# Patient Record
Sex: Male | Born: 1940 | Race: Black or African American | Hispanic: No | Smoking: Current every day smoker
Health system: Southern US, Community
[De-identification: ages and names within clinical notes are randomized; demographics above are authoritative.]

## PROBLEM LIST (undated history)

## (undated) DIAGNOSIS — J189 Pneumonia, unspecified organism: Secondary | ICD-10-CM

## (undated) DIAGNOSIS — I10 Essential (primary) hypertension: Secondary | ICD-10-CM

## (undated) DIAGNOSIS — C801 Malignant (primary) neoplasm, unspecified: Secondary | ICD-10-CM

## (undated) HISTORY — DX: Essential (primary) hypertension: I10

## (undated) HISTORY — PX: HERNIA REPAIR: SHX51

---

## 1979-09-20 HISTORY — PX: TONSILLECTOMY: SHX5217

## 2014-04-13 LAB — CBC AND DIFFERENTIAL
Hemoglobin: 12.8 g/dL — AB (ref 13.5–17.5)
Platelets: 189 10*3/uL (ref 150–399)
WBC: 8.7 10^3/mL

## 2014-04-13 LAB — BASIC METABOLIC PANEL
CREATININE: 1.3 mg/dL (ref 0.6–1.3)
Glucose: 111 mg/dL
Potassium: 3.9 mmol/L (ref 3.4–5.3)

## 2014-04-13 LAB — HEPATIC FUNCTION PANEL
ALT: 21 U/L (ref 10–40)
AST: 34 U/L (ref 14–40)
Alkaline Phosphatase: 73 U/L (ref 25–125)

## 2014-12-26 ENCOUNTER — Ambulatory Visit: Payer: Self-pay | Admitting: Family Medicine

## 2015-01-06 ENCOUNTER — Encounter: Payer: Self-pay | Admitting: Family Medicine

## 2015-01-06 ENCOUNTER — Ambulatory Visit (INDEPENDENT_AMBULATORY_CARE_PROVIDER_SITE_OTHER): Payer: Medicare Other | Admitting: Family Medicine

## 2015-01-06 VITALS — BP 133/82 | HR 76 | Ht 68.0 in | Wt 166.0 lb

## 2015-01-06 DIAGNOSIS — R1084 Generalized abdominal pain: Secondary | ICD-10-CM | POA: Diagnosis not present

## 2015-01-06 DIAGNOSIS — I10 Essential (primary) hypertension: Secondary | ICD-10-CM | POA: Diagnosis not present

## 2015-01-06 DIAGNOSIS — J449 Chronic obstructive pulmonary disease, unspecified: Secondary | ICD-10-CM | POA: Insufficient documentation

## 2015-01-06 DIAGNOSIS — M25519 Pain in unspecified shoulder: Secondary | ICD-10-CM

## 2015-01-06 DIAGNOSIS — N400 Enlarged prostate without lower urinary tract symptoms: Secondary | ICD-10-CM | POA: Diagnosis not present

## 2015-01-06 DIAGNOSIS — K219 Gastro-esophageal reflux disease without esophagitis: Secondary | ICD-10-CM | POA: Insufficient documentation

## 2015-01-06 MED ORDER — AMLODIPINE BESYLATE 5 MG PO TABS: 5.0000 mg | ORAL_TABLET | Freq: Every day | ORAL | Status: DC

## 2015-01-06 MED ORDER — LISINOPRIL-HYDROCHLOROTHIAZIDE 20-12.5 MG PO TABS: 1.0000 | ORAL_TABLET | Freq: Every day | ORAL | Status: DC

## 2015-01-06 MED ORDER — TRAMADOL HCL 50 MG PO TABS
50.0000 mg | ORAL_TABLET | Freq: Three times a day (TID) | ORAL | Status: DC | PRN
Start: 2015-01-06 — End: 2016-05-18

## 2015-01-06 MED ORDER — TAMSULOSIN HCL 0.4 MG PO CAPS: 0.4000 mg | ORAL_CAPSULE | Freq: Every day | ORAL | Status: DC

## 2015-01-06 NOTE — Progress Notes (Addendum)
CC: Jeffrey Watson is a 74 y.o. male is here for Establish Care   Subjective: HPI:  Pleasant 74 year old here to establish care  Reports a history of COPD. He tells me he's been a smoker for 60 years now. He thought about quitting but has never had any success with actually attempting to quit. He tells me he's been diagnosed with COPD and currently uses albuterol a few times a week for shortness of breath. He denies any recent decline in the severity of his shortness of breath described as mild and infrequent. Complains of a cough that's present frequently throughout the day nonproductive and has been present for years, mild in severity.  Reports a history of essential hypertension that stems back for an unknown amount of time but at least the last year. Symptoms were uncontrolled on lisinopril-hydrochlorothiazide alone. He describes having blood pressure spikes up into the stage II hypertension range on that regimen however once amlodipine 5 mg was added to his blood pressure has been stable in the normotensive range. He's run out of medication as of this morning and is requesting refills. He believes he had blood work done last week that possibly checked his kidney function.  Reports a history of BPH and without Flomax will urinate 3-4 times at night. He tells me that provided he takes this and what sounds to be a over-the-counter version of saw palmetto he has no difficulty going to the night without urinating, has no difficulty with urinating in general, and declines any weak stream or urinary hesitancy.  He reports some generalized abdominal pain that is localized around the umbilicus that radiates from the left to right and lasts about 5 seconds, it's described as sharp, nothing particularly makes it better or worse. It goes away without any particular intervention. Happens a couple times a week. No dietary link to symptoms nor a pattern of phallus his bowel habits. He had a CT scan done of the  abdomen last week and was told that he should consider getting surgery for umbilical hernia. He denies any pain at the umbilicus or any protrusion at the umbilicus recently or remotely.  Complains of bilateral shoulder pain localized in the muscles if he is active during the day. In the past he was given Lortab that he took a few times a week if he was having a particularly strenuous day. Pain is localized on the back of the shoulders somewhat in the joints. He's had trigger point injections but they were unsuccessful at helping with pain. Over-the-counter medications do not seem to provide any benefit from pain relief. Severity is moderate at most absent 20 minutes after taking Lortab.   Review of Systems - General ROS: negative for - chills, fever, night sweats, weight gain or weight loss Ophthalmic ROS: negative for - decreased vision Psychological ROS: negative for - anxiety or depression ENT ROS: negative for - hearing change, nasal congestion, tinnitus or allergies Hematological and Lymphatic ROS: negative for - bleeding problems, bruising or swollen lymph nodes Breast ROS: negative Respiratory ROS: no cough, shortness of breath, or wheezing Cardiovascular ROS: no chest pain or dyspnea on exertion Gastrointestinal ROS: no abdominal pain, change in bowel habits, or black or bloody stools Genito-Urinary ROS: negative for - genital discharge, genital ulcers, incontinence or abnormal bleeding from genitals Musculoskeletal ROS: negative for - joint pain or muscle pain Neurological ROS: negative for - headaches or memory loss Dermatological ROS: negative for lumps, mole changes, rash and skin lesion changes  Past Medical  History  Diagnosis Date  . Hypertension     Past Surgical History  Procedure Laterality Date  . Tonsillectomy  1981   Family History  Problem Relation Age of Onset  . Stomach cancer      Grandma  . Heart attack Mother   . Stroke      Grandma    History   Social  History  . Marital Status: Unknown    Spouse Name: N/A  . Number of Children: N/A  . Years of Education: N/A   Occupational History  . Not on file.   Social History Main Topics  . Smoking status: Current Every Day Smoker -- 60 years  . Smokeless tobacco: Not on file  . Alcohol Use: 3.0 oz/week    5 Standard drinks or equivalent per week  . Drug Use: No  . Sexual Activity: Not Currently   Other Topics Concern  . Not on file   Social History Narrative  . No narrative on file     Objective: BP 133/82 mmHg  Pulse 76  Ht '5\' 8"'$  (1.727 m)  Wt 166 lb (75.297 kg)  BMI 25.25 kg/m2  General: Alert and Oriented, No Acute Distress HEENT: Pupils equal, round, reactive to light. Conjunctivae clear.  Moist mucous membranes pharynx unremarkable Lungs: Clear to auscultation bilaterally, no wheezing/ronchi/rales.  Comfortable work of breathing. Good air movement. Cardiac: Regular rate and rhythm. Normal S1/S2.  No murmurs, rubs, nor gallops.   Abdomen: Soft nontender, no palpable masses especially at the umbilicus Extremities: No peripheral edema.  Strong peripheral pulses.  Mental Status: No depression, anxiety, nor agitation. Skin: Warm and dry.  Assessment & Plan: Jeffrey Watson was seen today for establish care.  Diagnoses and all orders for this visit:  COPD, mild  Essential hypertension Orders: -     lisinopril-hydrochlorothiazide (PRINZIDE,ZESTORETIC) 20-12.5 MG per tablet; Take 1 tablet by mouth daily. -     amLODipine (NORVASC) 5 MG tablet; Take 1 tablet (5 mg total) by mouth daily.  BPH (benign prostatic hyperplasia) Orders: -     tamsulosin (FLOMAX) 0.4 MG CAPS capsule; Take 1 capsule (0.4 mg total) by mouth daily.  Generalized abdominal pain  Pain in joint, shoulder region, unspecified laterality  Other orders -     traMADol (ULTRAM) 50 MG tablet; Take 1 tablet (50 mg total) by mouth every 8 (eight) hours as needed for moderate pain.   COPD: Urged to quit smoking advised  to consider using nicotine patch to help with smoking cessation. Needs new nebulizer machine to use PRN albuterol solution. Essential hypertension: Controlled continue lisinopril-hydrochlorothiazide and amlodipine. Requesting outside records for most recent renal function check BPH: Controlled on Flomax Shoulder pain: I will not be able to help him with a long-term prescription of any opiates other than tramadol. Generalized abdominal pain: Requesting outside records of most recent blood work and CT scan since I'm unable to detect any abdominal wall defect around his umbilicus.   Return in about 3 months (around 04/07/2015).

## 2015-01-08 ENCOUNTER — Telehealth: Payer: Self-pay | Admitting: Family Medicine

## 2015-01-08 NOTE — Telephone Encounter (Signed)
Pt.notified

## 2015-01-08 NOTE — Telephone Encounter (Signed)
Jeffrey Watson, Will you please let patinet know that I got his abdominal CT scan from July and it showed no abnormality of the abdomen.  He mentioned something about surgery might be needed on the abdomen however these results show no need for any surgical procedure.

## 2015-01-20 ENCOUNTER — Encounter: Payer: Self-pay | Admitting: Family Medicine

## 2015-01-21 ENCOUNTER — Encounter: Payer: Self-pay | Admitting: Family Medicine

## 2015-01-21 ENCOUNTER — Ambulatory Visit (INDEPENDENT_AMBULATORY_CARE_PROVIDER_SITE_OTHER): Payer: Medicare Other | Admitting: Family Medicine

## 2015-01-21 ENCOUNTER — Ambulatory Visit (INDEPENDENT_AMBULATORY_CARE_PROVIDER_SITE_OTHER): Payer: Medicare Other

## 2015-01-21 VITALS — BP 147/77 | HR 97 | Wt 165.0 lb

## 2015-01-21 DIAGNOSIS — M542 Cervicalgia: Secondary | ICD-10-CM

## 2015-01-21 DIAGNOSIS — R1084 Generalized abdominal pain: Secondary | ICD-10-CM

## 2015-01-21 DIAGNOSIS — M47892 Other spondylosis, cervical region: Secondary | ICD-10-CM | POA: Diagnosis not present

## 2015-01-21 NOTE — Progress Notes (Signed)
CC: Jeffrey Watson is a 74 y.o. male is here for right shoulder pain   Subjective: HPI:  Complains of posterior neck pain at the base of the neck that has been present off and on for matter of years. Tramadol and I gave him at the last visit has been ineffective. He's tried a variety of muscle relaxers that are also ineffective. He is not sure if he's ever had imaging done of the neck. Pain is radiating across the top of the shoulders. It's worse with any sudden left or right rotation of the neck or any left or right side bending. Percocet has helped in the past. Pain seems to be worse if he is out of strenuous day with using his upper extremities. Denies any recent or remote trauma or overexertion. Denies any shoulder joint pain. Symptoms are mild-to-moderate in severity. Denies any motor or sensory disturbances in the upper extremities   Complains of abdominal pain that is localized in the epigastric region which is nonradiating. It described as a soreness that comes on for matter of seconds. It does not seem to correlate with dietary habits or bowel movements. He denies diarrhea but does have subjective constipation where he will frequently go 3 days without having a bowel movement. He has had unintentional weight gain. He tells me that he had a CT scan done within the last 1 or 2 months and was told that he needed surgery by his former PCP but he decided to avoid this intervention, he is uncertain of the results of the CT. Denies nausea, vomiting or flank pain.   Review Of Systems Outlined In HPI  Past Medical History  Diagnosis Date  . Hypertension     Past Surgical History  Procedure Laterality Date  . Tonsillectomy  1981   Family History  Problem Relation Age of Onset  . Stomach cancer      Grandma  . Heart attack Mother   . Stroke      Grandma    History   Social History  . Marital Status: Unknown    Spouse Name: N/A  . Number of Children: N/A  . Years of Education: N/A    Occupational History  . Not on file.   Social History Main Topics  . Smoking status: Current Every Day Smoker -- 60 years  . Smokeless tobacco: Not on file  . Alcohol Use: 3.0 oz/week    5 Standard drinks or equivalent per week  . Drug Use: No  . Sexual Activity: Not Currently   Other Topics Concern  . Not on file   Social History Narrative     Objective: BP 147/77 mmHg  Pulse 97  Wt 165 lb (74.844 kg)  General: Alert and Oriented, No Acute Distress HEENT: Pupils equal, round, reactive to light. Conjunctivae clear.  Moist mucous membranes Lungs:  Clear comfortable work of breathing Cardiac: Regular rate and rhythm.  Neck: Full range of motion and strength of the cervical spine. Pain is reproduced with palpation of the C6 spinous process. Pain is also reproduced with paraspinal musculature and upper trapezius palpation just lateral to C6 and C5. Upper trapezius is moderately hypertonic. Spurling's negative bilaterally Abdomen:  Soft flat Extremities: No peripheral edema.  Strong peripheral pulses.  Mental Status: No depression, anxiety, nor agitation. Skin: Warm and dry.  Assessment & Plan: Jeffrey Watson was seen today for right shoulder pain.  Diagnoses and all orders for this visit:  Generalized abdominal pain  Neck pain Orders: -  DG Cervical Spine Complete; Future    generalized abdominal pain: Records that I received from his former providers office included a CT scan most recently from July 2015 that did not show any abnormality to account for his abdominal pain. He strongly believes that he had a CT scan sometime within the last 1 or 2 months at [facility] in [facility]. I've asked my assistant to reach out to them specifically regarding any CT scans from now stemming back to July 2015. Neck pain: Cervical spine films will determine further treatment plan, obtain films today.  Return if symptoms worsen or fail to improve.

## 2015-01-22 ENCOUNTER — Telehealth: Payer: Self-pay | Admitting: Family Medicine

## 2015-01-22 DIAGNOSIS — M542 Cervicalgia: Secondary | ICD-10-CM

## 2015-01-22 MED ORDER — LUBIPROSTONE 24 MCG PO CAPS: 24.0000 ug | ORAL_CAPSULE | Freq: Two times a day (BID) | ORAL | Status: DC

## 2015-01-22 NOTE — Telephone Encounter (Signed)
Jeffrey Watson, Will you please let patient know that I reviewed his CT scan from 01/02/2015 and it did not show any abnormality to account for his abdominal pain therefore I suspect his pain is coming from constipation and I've sent a Rx of lubiprostone to his [facility] in hopes that it relieves his pain.

## 2015-01-22 NOTE — Telephone Encounter (Signed)
Pt.notified

## 2015-01-22 NOTE — Telephone Encounter (Signed)
Left message on vm

## 2015-01-22 NOTE — Telephone Encounter (Signed)
Jeffrey Watson, Will you please let patient know that his neck xrays show multiple levels of age related degenerative changes and the radiologist who looked at his films has recommended a MRI for further evaluation of where his pain is coming from, an order has been placed for this, please contact me if not notified about scheduling by next week.

## 2015-02-23 ENCOUNTER — Encounter: Payer: Self-pay | Admitting: Family Medicine

## 2015-02-23 ENCOUNTER — Ambulatory Visit (INDEPENDENT_AMBULATORY_CARE_PROVIDER_SITE_OTHER): Payer: Medicare Other | Admitting: Family Medicine

## 2015-02-23 ENCOUNTER — Ambulatory Visit (INDEPENDENT_AMBULATORY_CARE_PROVIDER_SITE_OTHER): Payer: Medicare Other

## 2015-02-23 VITALS — BP 153/86 | HR 64 | Wt 162.0 lb

## 2015-02-23 DIAGNOSIS — M7712 Lateral epicondylitis, left elbow: Secondary | ICD-10-CM

## 2015-02-23 DIAGNOSIS — R1084 Generalized abdominal pain: Secondary | ICD-10-CM

## 2015-02-23 DIAGNOSIS — R140 Abdominal distension (gaseous): Secondary | ICD-10-CM | POA: Diagnosis not present

## 2015-02-23 MED ORDER — DICYCLOMINE HCL 20 MG PO TABS: ORAL_TABLET | ORAL | Status: DC

## 2015-02-23 NOTE — Progress Notes (Signed)
CC: Jeffrey Watson is a 74 y.o. male is here for f/u stomach pain   Subjective: HPI:  Abdominal pain localized in the anterior aspect of the lower abdomen feels like it's deep which lasts anywhere from 1-10 seconds. Nothing particularly makes it better or worse. It can happen when he is eating, anytime after eating or while fasting. Nothing seems to make it better. It occurs 1-3 times a day. There is no radiation. He feels like it has almost electric quality to it. Interventions have included Amitiza which has helped with regular bowel movements but nothing with the pain. He had a CT scan to workup this pain earlier this year which was unremarkable. He is uncertain how long he's had the discomfort but it's been at least since the start of 2016. Denies any nausea, unintentional weight loss, diarrhea nor constipation.  Review of systems positive for a bloating sensation in the abdomen.  Left lateral elbow pain that began 2 weeks ago the day after he was lifting heavy objects with a friend. Worse with lifting light objects such as a coffee mug. Nonradiating. Feels sharp. Mild in severity. He's never had this before denies any overlying skin changes at the site of discomfort. Symptoms are slowly improving without any particular intervention.   Review Of Systems Outlined In HPI  Past Medical History  Diagnosis Date  . Hypertension     Past Surgical History  Procedure Laterality Date  . Tonsillectomy  1981   Family History  Problem Relation Age of Onset  . Stomach cancer      Grandma  . Heart attack Mother   . Stroke      Grandma    History   Social History  . Marital Status: Unknown    Spouse Name: N/A  . Number of Children: N/A  . Years of Education: N/A   Occupational History  . Not on file.   Social History Main Topics  . Smoking status: Current Every Day Smoker -- 60 years  . Smokeless tobacco: Not on file  . Alcohol Use: 3.0 oz/week    5 Standard drinks or equivalent per week   . Drug Use: No  . Sexual Activity: Not Currently   Other Topics Concern  . Not on file   Social History Narrative     Objective: BP 153/86 mmHg  Pulse 64  Wt 162 lb (73.483 kg)  General: Alert and Oriented, No Acute Distress HEENT: Pupils equal, round, reactive to light. Conjunctivae clear.  Moist mucous membranes Lungs: Clear to auscultation bilaterally, no wheezing/ronchi/rales.  Comfortable work of breathing. Good air movement. Cardiac: Regular rate and rhythm. Normal S1/S2.  No murmurs, rubs, nor gallops.   Abdomen: Normal bowel sounds, soft and non tender without palpable masses. No guarding or rebound tenderness Extremities: No peripheral edema.  Strong peripheral pulses. Left Elbow pain localized to the lateral epicondyle reproduced with only direct pressure at the site. Mental Status: No depression, anxiety, nor agitation. Skin: Warm and dry.  Assessment & Plan: Nicholson was seen today for f/u stomach pain.  Diagnoses and all orders for this visit:  Generalized abdominal pain Orders: -     DG Abd 2 Views; Future  Abdominal bloating Orders: -     DG Abd 2 Views; Future -     dicyclomine (BENTYL) 20 MG tablet; One by mouth up to every six hours only as needed for abdominal swelling or discomfort.  Left tennis elbow Orders: -     DG Abd 2  Views; Future   X-ray was obtained to evaluate for fecal burden, does not appear that constipation is playing into his pain therefore will treat as bowel spasms with dicyclomine and increasing fiber in diet. Left tennis elbow: Home rebuilt up exercises were provided to help speed up the healing process.  No Follow-up on file.

## 2015-02-27 ENCOUNTER — Telehealth: Payer: Self-pay | Admitting: Family Medicine

## 2015-02-27 DIAGNOSIS — I10 Essential (primary) hypertension: Secondary | ICD-10-CM

## 2015-02-27 MED ORDER — LISINOPRIL-HYDROCHLOROTHIAZIDE 20-12.5 MG PO TABS: 1.0000 | ORAL_TABLET | Freq: Every day | ORAL | Status: DC

## 2015-02-27 NOTE — Telephone Encounter (Signed)
Refill req 

## 2015-03-09 ENCOUNTER — Other Ambulatory Visit: Payer: Medicare Other

## 2015-03-16 ENCOUNTER — Telehealth: Payer: Self-pay | Admitting: Family Medicine

## 2015-03-16 ENCOUNTER — Ambulatory Visit: Payer: Medicare Other

## 2015-03-16 MED ORDER — DIAZEPAM 10 MG PO TABS: ORAL_TABLET | ORAL | Status: DC

## 2015-03-16 NOTE — Addendum Note (Signed)
Addended by: Marcial Pacas on: 03/16/2015 05:00 PM   Modules accepted: Orders

## 2015-03-16 NOTE — Telephone Encounter (Signed)
Jeffrey Watson, Rx placed in in-box ready for pickup/faxing. I need the MRI results before I can give him options on how to improve the pain. For now try OTC Aleve up to two tabs BID

## 2015-03-16 NOTE — Telephone Encounter (Signed)
Pt had mri scheduled today but could not complete due to being claustrophobic and the person in imaging sent him back up here to see if you could prescribe him something for the anxiety so he is able to complete the mri. The MRI authorization expires 04/25/15. Also the pt wants to know if he can be prescribed something for pain.

## 2015-03-17 NOTE — Telephone Encounter (Signed)
Pt.notified

## 2015-03-24 ENCOUNTER — Ambulatory Visit (INDEPENDENT_AMBULATORY_CARE_PROVIDER_SITE_OTHER): Payer: Medicare Other

## 2015-03-24 DIAGNOSIS — M5032 Other cervical disc degeneration, mid-cervical region: Secondary | ICD-10-CM

## 2015-03-24 DIAGNOSIS — M4802 Spinal stenosis, cervical region: Secondary | ICD-10-CM

## 2015-03-24 DIAGNOSIS — M2578 Osteophyte, vertebrae: Secondary | ICD-10-CM | POA: Diagnosis not present

## 2015-03-24 DIAGNOSIS — M5022 Other cervical disc displacement, mid-cervical region: Secondary | ICD-10-CM

## 2015-03-25 ENCOUNTER — Telehealth: Payer: Self-pay | Admitting: Family Medicine

## 2015-03-25 DIAGNOSIS — M5030 Other cervical disc degeneration, unspecified cervical region: Secondary | ICD-10-CM

## 2015-03-25 DIAGNOSIS — M4802 Spinal stenosis, cervical region: Secondary | ICD-10-CM

## 2015-03-25 NOTE — Telephone Encounter (Signed)
Jeffrey Watson, Will you please let patient know that his MRI confirmed some impingement on his spinal canal and nerve roots which would explain why he's having pain.  I'm placing a pain management referral for further help.  When I return from my conference I can get him a Rx of Vicodin but I can't print this out in my current situation.

## 2015-03-25 NOTE — Telephone Encounter (Signed)
Pt.notified

## 2015-03-27 ENCOUNTER — Telehealth: Payer: Self-pay | Admitting: *Deleted

## 2015-03-27 DIAGNOSIS — M5030 Other cervical disc degeneration, unspecified cervical region: Secondary | ICD-10-CM

## 2015-03-27 NOTE — Telephone Encounter (Signed)
Referral placed.

## 2015-03-30 MED ORDER — HYDROCODONE-ACETAMINOPHEN 5-325 MG PO TABS: 1.0000 | ORAL_TABLET | Freq: Four times a day (QID) | ORAL | Status: DC | PRN

## 2015-03-30 NOTE — Telephone Encounter (Signed)
Pt notified and rx up front

## 2015-03-30 NOTE — Addendum Note (Signed)
Addended by: Marcial Pacas on: 03/30/2015 03:15 PM   Modules accepted: Orders

## 2015-03-30 NOTE — Telephone Encounter (Signed)
Andrea, Rx placed in in-box ready for pickup/faxing.  

## 2015-04-08 ENCOUNTER — Ambulatory Visit: Payer: Medicare Other | Attending: Family Medicine | Admitting: Physical Therapy

## 2015-06-01 ENCOUNTER — Ambulatory Visit (INDEPENDENT_AMBULATORY_CARE_PROVIDER_SITE_OTHER): Payer: Medicare Other

## 2015-06-01 ENCOUNTER — Encounter: Payer: Self-pay | Admitting: Family Medicine

## 2015-06-01 ENCOUNTER — Ambulatory Visit (INDEPENDENT_AMBULATORY_CARE_PROVIDER_SITE_OTHER): Payer: Medicare Other | Admitting: Family Medicine

## 2015-06-01 VITALS — BP 136/75 | HR 75 | Wt 162.0 lb

## 2015-06-01 DIAGNOSIS — R079 Chest pain, unspecified: Secondary | ICD-10-CM | POA: Diagnosis not present

## 2015-06-01 DIAGNOSIS — J449 Chronic obstructive pulmonary disease, unspecified: Secondary | ICD-10-CM

## 2015-06-01 NOTE — Progress Notes (Signed)
CC: Jeffrey Watson is a 74 y.o. male is here for pain in left shoulder   Subjective: HPI:  Yesterday up early in the morning around 2 AM to urinate when he got back to bed and was playing on his phone he had a sudden onset of moderate left-sided chest pain just below the clavicle. Nothing seemed to make symptoms better or worse. After 5 or 10 minutes it went away "like a switch just turned it off immediately". No interventions as of yet. He's never had this before. It was nonradiating and described as a throbbing sensation. He denies any accompanying motor or sensory disturbances, radiation of his pain nor shortness of breath. He denies any pain with breathing, cough, fevers, chills nor persistent pain. He tells me that pain felt extremely different than his chronic neck pain which is currently well controlled with medicine from his pain management clinic.   Review Of Systems Outlined In HPI  Past Medical History  Diagnosis Date  . Hypertension     Past Surgical History  Procedure Laterality Date  . Tonsillectomy  1981   Family History  Problem Relation Age of Onset  . Stomach cancer      Grandma  . Heart attack Mother   . Stroke      Grandma    Social History   Social History  . Marital Status: Unknown    Spouse Name: N/A  . Number of Children: N/A  . Years of Education: N/A   Occupational History  . Not on file.   Social History Main Topics  . Smoking status: Current Every Day Smoker -- 60 years  . Smokeless tobacco: Not on file  . Alcohol Use: 3.0 oz/week    5 Standard drinks or equivalent per week  . Drug Use: No  . Sexual Activity: Not Currently   Other Topics Concern  . Not on file   Social History Narrative     Objective: BP 136/75 mmHg  Pulse 75  Wt 162 lb (73.483 kg)  General: Alert and Oriented, No Acute Distress HEENT: Pupils equal, round, reactive to light. Conjunctivae clear.  Moist mucous membranes pharynx unremarkable  Lungs: Comfortable work of  breathing, no rales or wheezing. There is mild rhonchi in the left lung fields Cardiac: Regular rate and rhythm. Normal S1/S2.  No murmurs, rubs, nor gallops.   Left shoulder exam reveals full range of motion and strength in all planes of motion and with individual rotator cuff testing. No overlying redness warmth or swelling.  Neer's test negative.  Hawkins test negative. Empty can negative. Crossarm test negative. O'Brien's test negative. Apprehension test negative. Speed's test negative. Extremities: No peripheral edema.  Strong peripheral pulses.  Mental Status: No depression, anxiety, nor agitation. Skin: Warm and dry.  Assessment & Plan: Jeffrey Watson was seen today for pain in left shoulder.  Diagnoses and all orders for this visit:  Chest pain, unspecified chest pain type -     DG Chest 2 View; Future   Chest pain with a low suspicion of a cardiac etiology given how abruptly the onset was in the abrupt termination of pain. Obtain x-ray to rule out lung pathology especially given rhonchi on his exam today.Signs and symptoms requring emergent/urgent reevaluation were discussed with the patient. Ultimate plan will be based on the results of the x-ray  Return if symptoms worsen or fail to improve.

## 2015-06-04 ENCOUNTER — Other Ambulatory Visit: Payer: Self-pay | Admitting: Family Medicine

## 2015-06-23 ENCOUNTER — Other Ambulatory Visit: Payer: Self-pay | Admitting: Family Medicine

## 2015-07-06 ENCOUNTER — Ambulatory Visit (INDEPENDENT_AMBULATORY_CARE_PROVIDER_SITE_OTHER): Payer: Medicare Other | Admitting: Family Medicine

## 2015-07-06 ENCOUNTER — Encounter: Payer: Self-pay | Admitting: Family Medicine

## 2015-07-06 VITALS — BP 130/74 | HR 79 | Wt 164.0 lb

## 2015-07-06 DIAGNOSIS — J449 Chronic obstructive pulmonary disease, unspecified: Secondary | ICD-10-CM

## 2015-07-06 DIAGNOSIS — I10 Essential (primary) hypertension: Secondary | ICD-10-CM

## 2015-07-06 MED ORDER — AMLODIPINE BESYLATE 5 MG PO TABS: ORAL_TABLET | ORAL | Status: DC

## 2015-07-06 MED ORDER — LISINOPRIL-HYDROCHLOROTHIAZIDE 20-12.5 MG PO TABS: 1.0000 | ORAL_TABLET | Freq: Every day | ORAL | Status: DC

## 2015-07-06 MED ORDER — FLUTICASONE FUROATE-VILANTEROL 100-25 MCG/INH IN AEPB: INHALATION_SPRAY | RESPIRATORY_TRACT | Status: DC

## 2015-07-06 NOTE — Progress Notes (Signed)
CC: Jeffrey Watson is a 74 y.o. male is here for Hypertension   Subjective: HPI:   Follow-up essential hypertension: Continues to take amlodipine and lisinopril-HCTZ on a daily basis. No outside blood pressures report. Denies chest pain shortness of breath orthopnea nor peripheral edema.  Complains of productive cough that has been present for matter of years. He tells me he was actually was once on oxygen. He reports That albuterol provides some mild benefit. Denies any blood in sputum or chest pain. Nothing else seems to make symptoms better or worse.  Symptoms are mildly to moderately interfering with quality of life. Denies fevers, chills, nor unintentional weight loss.   Review Of Systems Outlined In HPI  Past Medical History  Diagnosis Date  . Hypertension     Past Surgical History  Procedure Laterality Date  . Tonsillectomy  1981   Family History  Problem Relation Age of Onset  . Stomach cancer      Grandma  . Heart attack Mother   . Stroke      Grandma    Social History   Social History  . Marital Status: Unknown    Spouse Name: N/A  . Number of Children: N/A  . Years of Education: N/A   Occupational History  . Not on file.   Social History Main Topics  . Smoking status: Current Every Day Smoker -- 60 years  . Smokeless tobacco: Not on file  . Alcohol Use: 3.0 oz/week    5 Standard drinks or equivalent per week  . Drug Use: No  . Sexual Activity: Not Currently   Other Topics Concern  . Not on file   Social History Narrative     Objective: BP 130/74 mmHg  Pulse 79  Wt 164 lb (74.39 kg)  General: Alert and Oriented, No Acute Distress HEENT: Pupils equal, round, reactive to light. Conjunctivae clear. Moist mucous membranes  Lungs: Clear to auscultation bilaterally, no wheezing/ronchi/rales.  Comfortable work of breathing. Good air movement. Cardiac: Regular rate and rhythm. Normal S1/S2.  No murmurs, rubs, nor gallops.   Extremities: No peripheral  edema.  Strong peripheral pulses.  Mental Status: No depression, anxiety, nor agitation. Skin: Warm and dry.  Assessment & Plan: Jeffrey Watson was seen today for hypertension.  Diagnoses and all orders for this visit:  Essential hypertension -     lisinopril-hydrochlorothiazide (PRINZIDE,ZESTORETIC) 20-12.5 MG tablet; Take 1 tablet by mouth daily.  COPD, mild ([place])  Other orders -     Fluticasone Furoate-Vilanterol (BREO ELLIPTA) 100-25 MCG/INH AEPB; One inhalation by mouth daily, wash mouth out after use. -     amLODipine (NORVASC) 5 MG tablet; TAKE ONE TABLET BY MOUTH ONCE DAILY   Essential hypertension: Controlled continue amlodipine and lisinopril-HCTZ COPD: Uncontrolled chronic condition continue as needed albuterol and beginning Breo.  if helping with cough I've asked him to call me for refills.   Return in about 3 months (around 10/06/2015).

## 2015-07-24 ENCOUNTER — Other Ambulatory Visit: Payer: Self-pay | Admitting: Family Medicine

## 2015-07-27 ENCOUNTER — Other Ambulatory Visit: Payer: Self-pay

## 2015-07-27 DIAGNOSIS — N400 Enlarged prostate without lower urinary tract symptoms: Secondary | ICD-10-CM

## 2015-07-27 MED ORDER — AMLODIPINE BESYLATE 5 MG PO TABS: ORAL_TABLET | ORAL | Status: DC

## 2015-07-27 MED ORDER — TAMSULOSIN HCL 0.4 MG PO CAPS: 0.4000 mg | ORAL_CAPSULE | Freq: Every day | ORAL | Status: DC

## 2015-10-06 ENCOUNTER — Encounter: Payer: Self-pay | Admitting: Family Medicine

## 2015-10-06 ENCOUNTER — Other Ambulatory Visit: Payer: Self-pay

## 2015-10-06 ENCOUNTER — Ambulatory Visit (INDEPENDENT_AMBULATORY_CARE_PROVIDER_SITE_OTHER): Payer: Self-pay | Admitting: Family Medicine

## 2015-10-06 DIAGNOSIS — Z0289 Encounter for other administrative examinations: Secondary | ICD-10-CM

## 2015-10-06 NOTE — Progress Notes (Signed)
No show 10/06/2015

## 2015-10-15 ENCOUNTER — Other Ambulatory Visit: Payer: Self-pay

## 2015-10-15 MED ORDER — RANITIDINE HCL 150 MG PO TABS: 150.0000 mg | ORAL_TABLET | Freq: Two times a day (BID) | ORAL | Status: DC

## 2016-01-12 ENCOUNTER — Other Ambulatory Visit: Payer: Self-pay | Admitting: Family Medicine

## 2016-01-31 ENCOUNTER — Other Ambulatory Visit: Payer: Self-pay | Admitting: Family Medicine

## 2016-02-01 ENCOUNTER — Telehealth: Payer: Self-pay | Admitting: Family Medicine

## 2016-02-01 NOTE — Telephone Encounter (Signed)
Refill req, needs follow up

## 2016-02-08 ENCOUNTER — Other Ambulatory Visit: Payer: Self-pay | Admitting: Family Medicine

## 2016-03-07 ENCOUNTER — Other Ambulatory Visit: Payer: Self-pay | Admitting: Family Medicine

## 2016-04-14 IMAGING — CR DG CERVICAL SPINE COMPLETE 4+V
5 series · 5 of 5 positions shown · non-contrast
Comparison: None.

CLINICAL DATA: Midline cervical pain for 1 or 2 months. No known
injury. Initial encounter.

EXAM:
CERVICAL SPINE  4+ VIEWS

[c-spine lat]
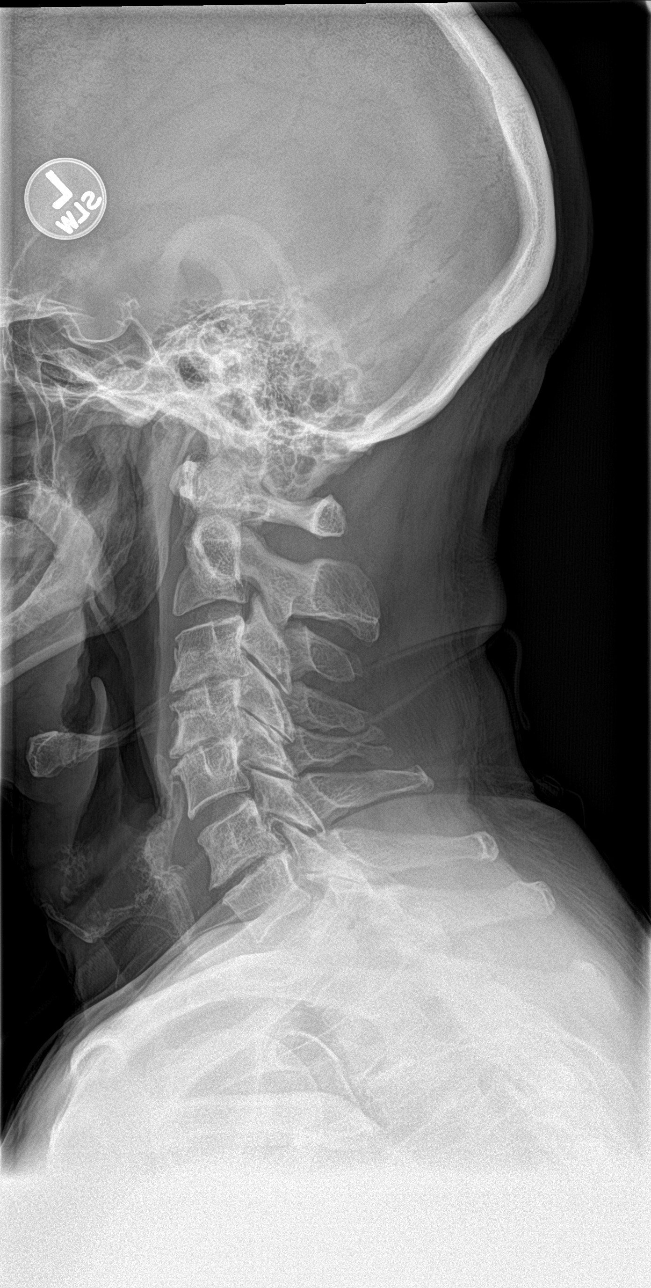

[c-spine obl (1 of 2)]
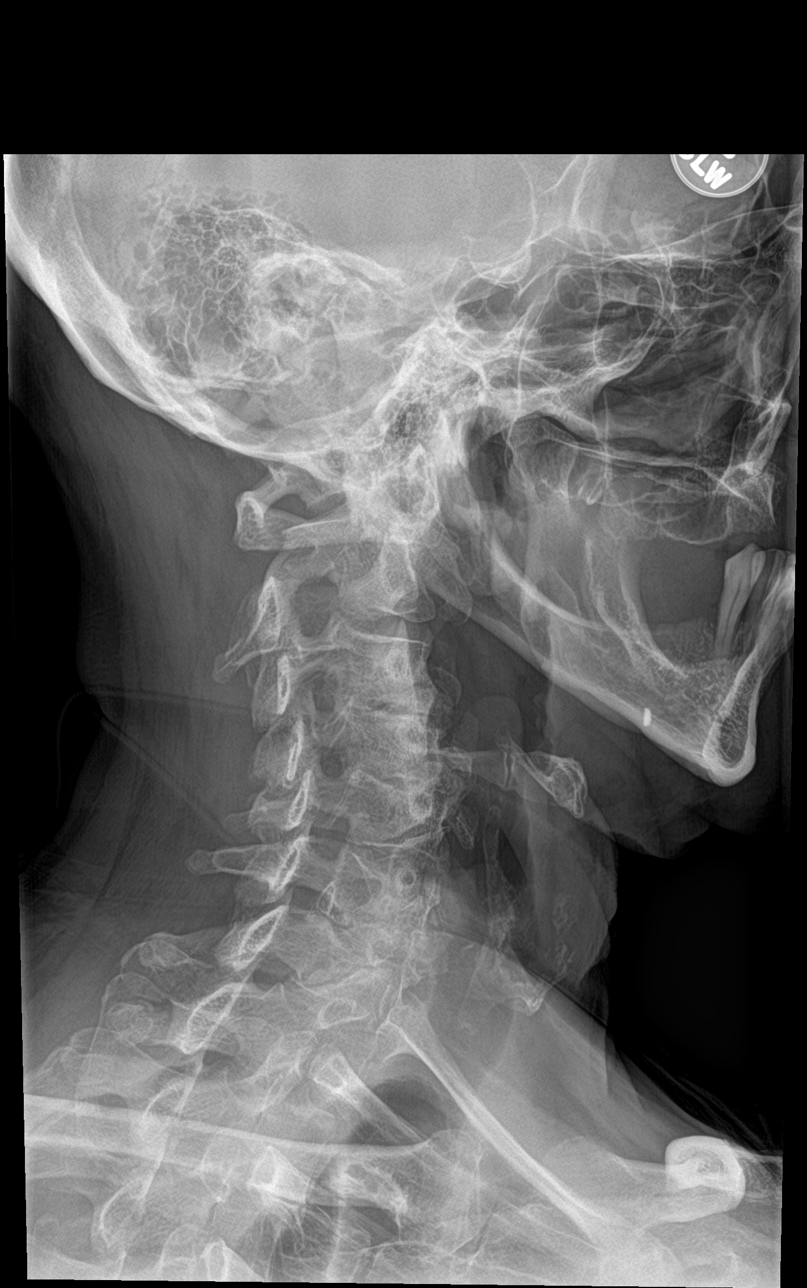

[c-spine obl (2 of 2)]
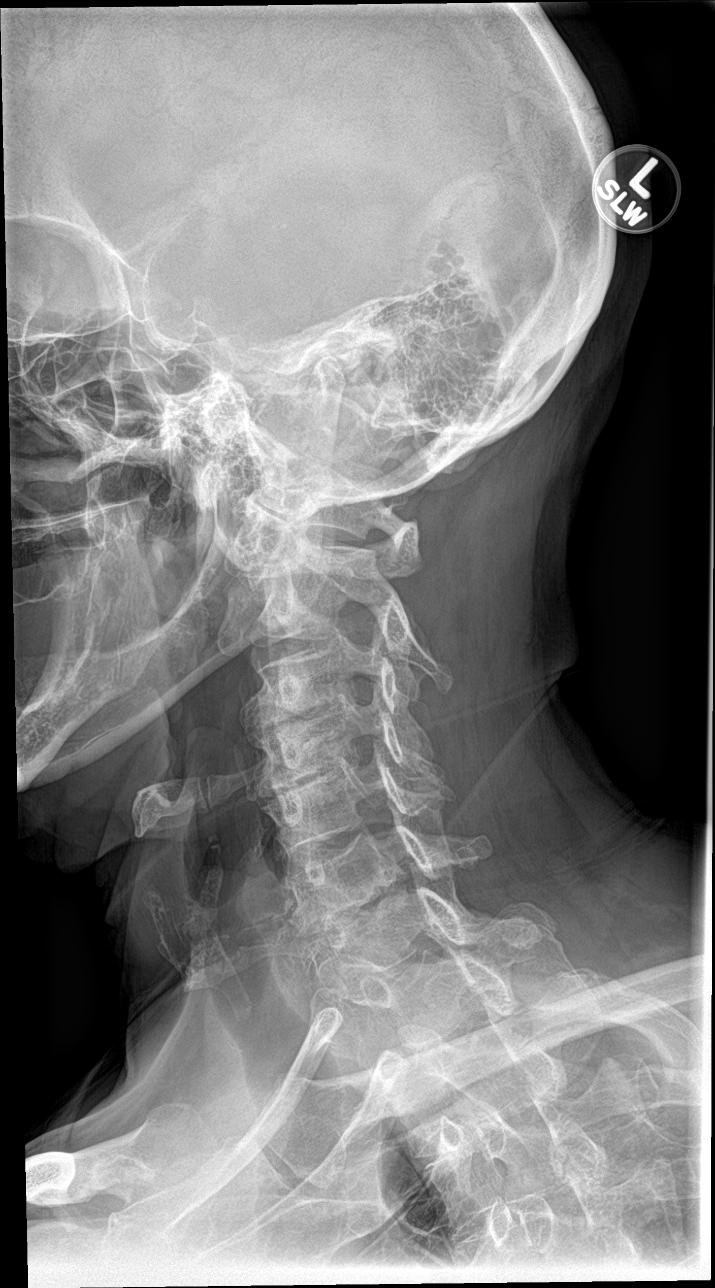

[c-spine ap]
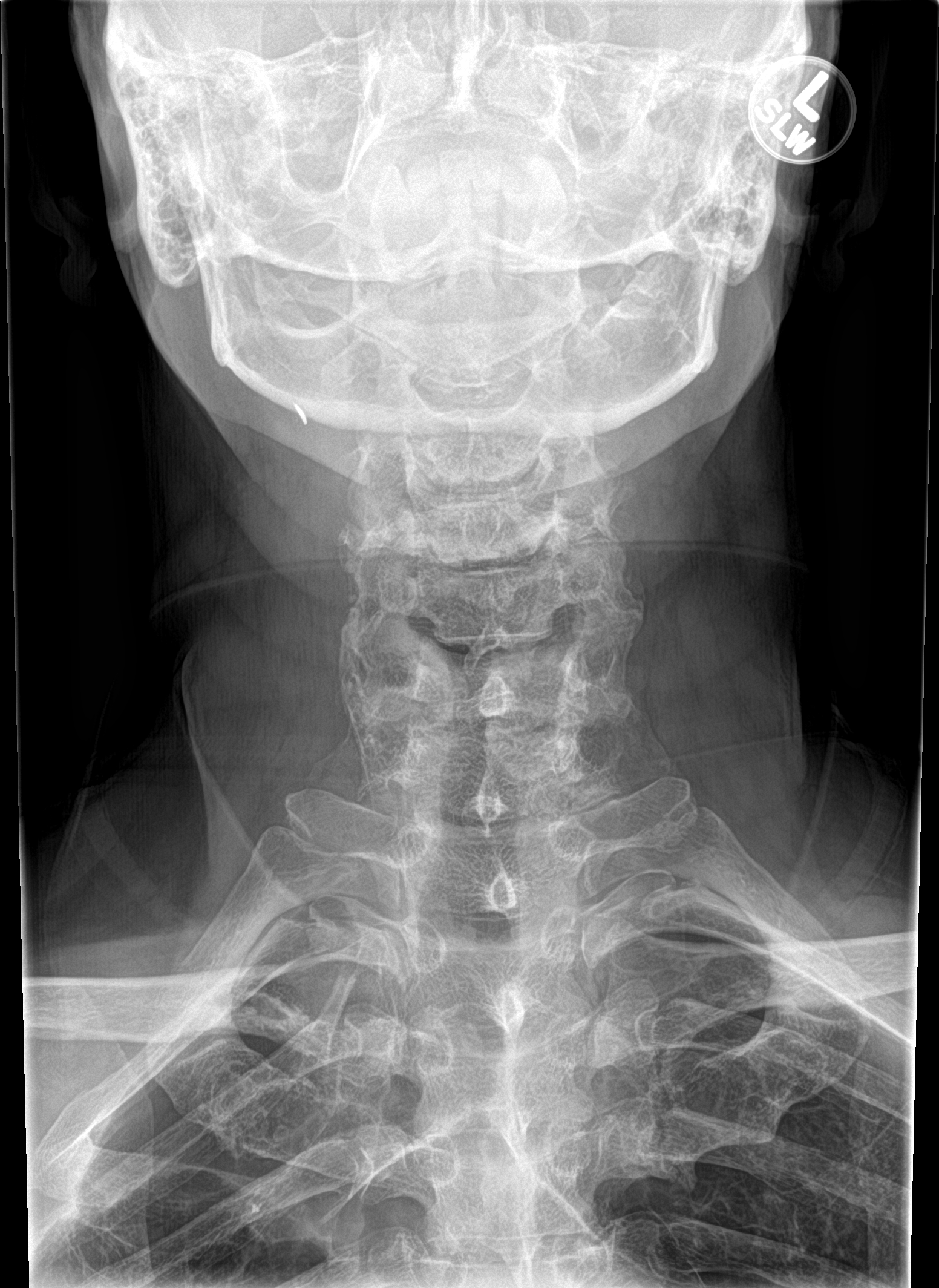

[c-spine open mouth]
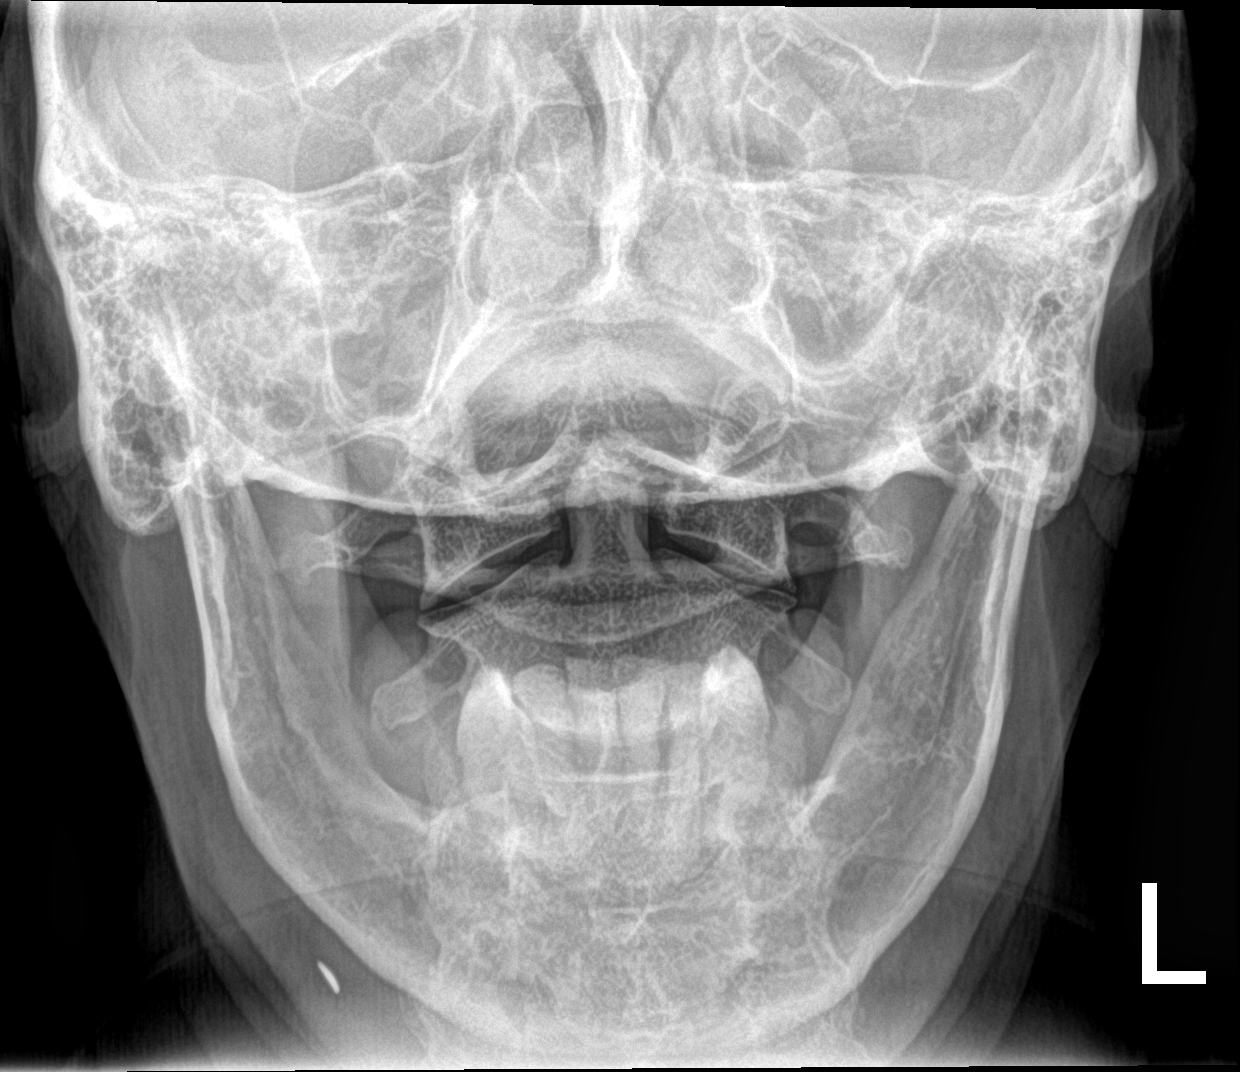

[5 of 5 positions shown; findings below may reference images not displayed]

FINDINGS: The prevertebral soft tissues are normal. The alignment is anatomic
through T1. There is no evidence of acute fracture or traumatic
subluxation. The C1-2 articulation appears normal in the AP
projection. There is advanced disc space loss with uncinate spurring
at C3-4 and C4-5. There is lesser disc space loss at C6-7. Oblique
views demonstrate mild biforaminal stenosis at C3-4 and C4-5 and
left-sided foraminal narrowing at C6-7.
IMPRESSION: Cervical spondylosis as described with associated foraminal
narrowing. No acute osseous findings or malalignment. Consider MRI
for further evaluation of the patient's symptoms.

## 2016-04-23 ENCOUNTER — Other Ambulatory Visit: Payer: Self-pay | Admitting: Family Medicine

## 2016-04-23 DIAGNOSIS — I10 Essential (primary) hypertension: Secondary | ICD-10-CM

## 2016-04-25 ENCOUNTER — Other Ambulatory Visit: Payer: Self-pay | Admitting: Family Medicine

## 2016-05-16 ENCOUNTER — Other Ambulatory Visit: Payer: Self-pay | Admitting: Family Medicine

## 2016-05-17 IMAGING — CR DG ABDOMEN 2V
3 series · 3 of 3 positions shown · non-contrast
Comparison: None.

CLINICAL DATA: Abdominal pain.

EXAM:
ABDOMEN - 2 VIEW

[abdomen erect]
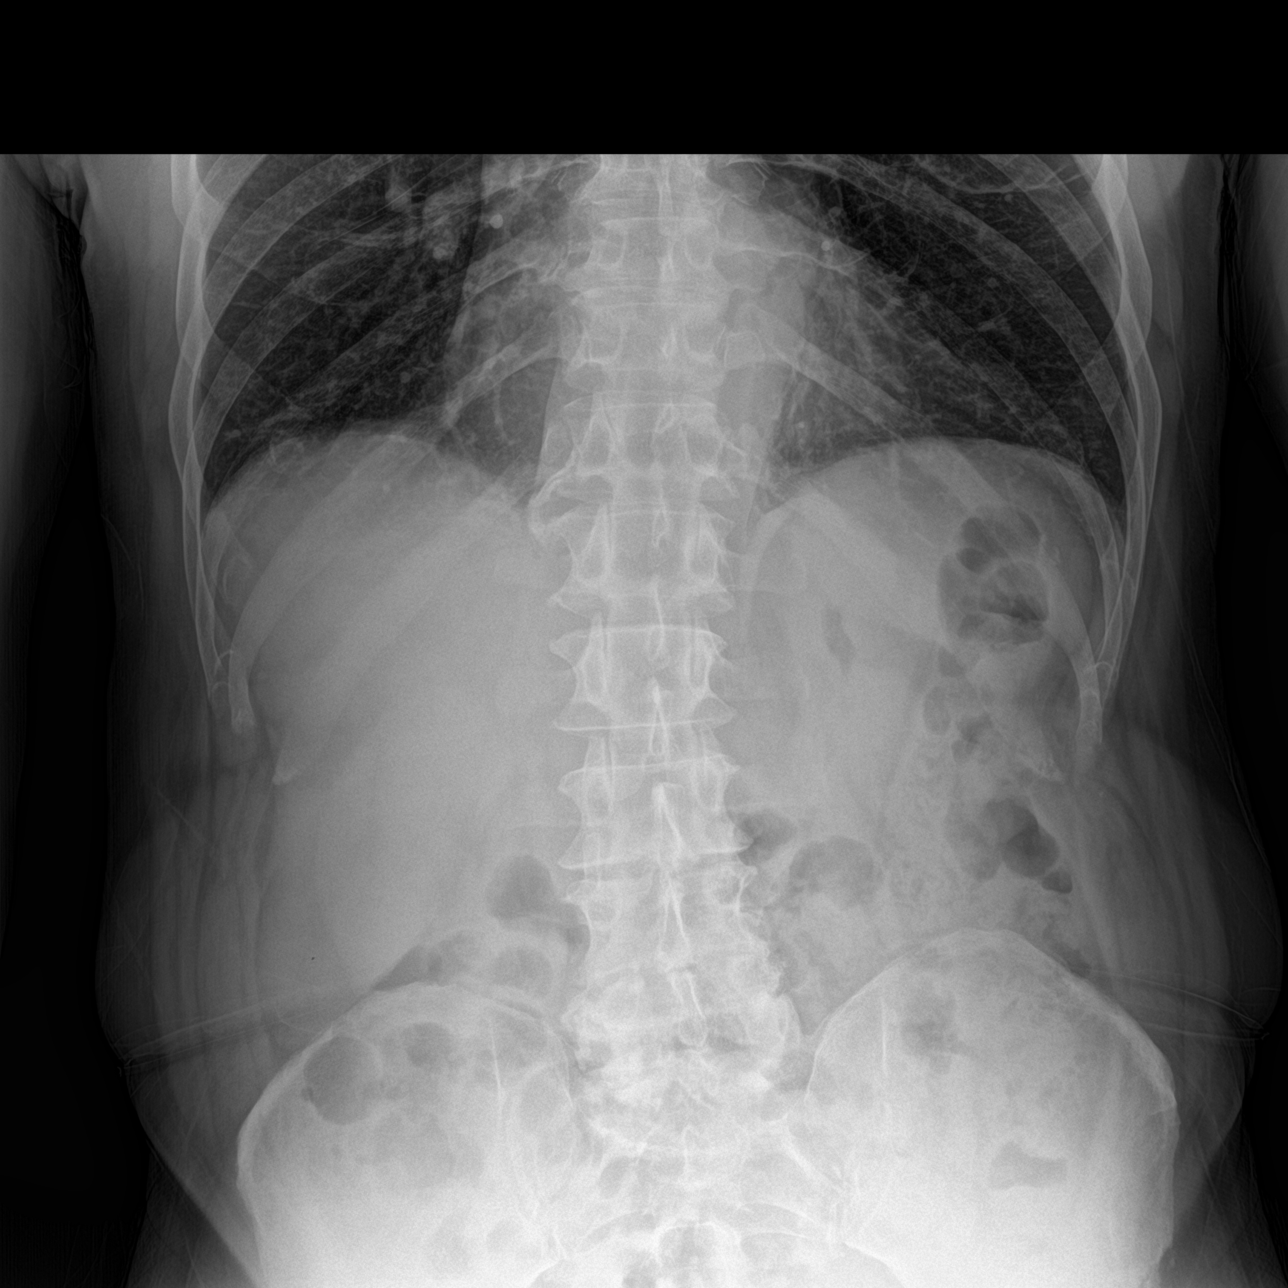

[abdomen supine (1 of 2)]
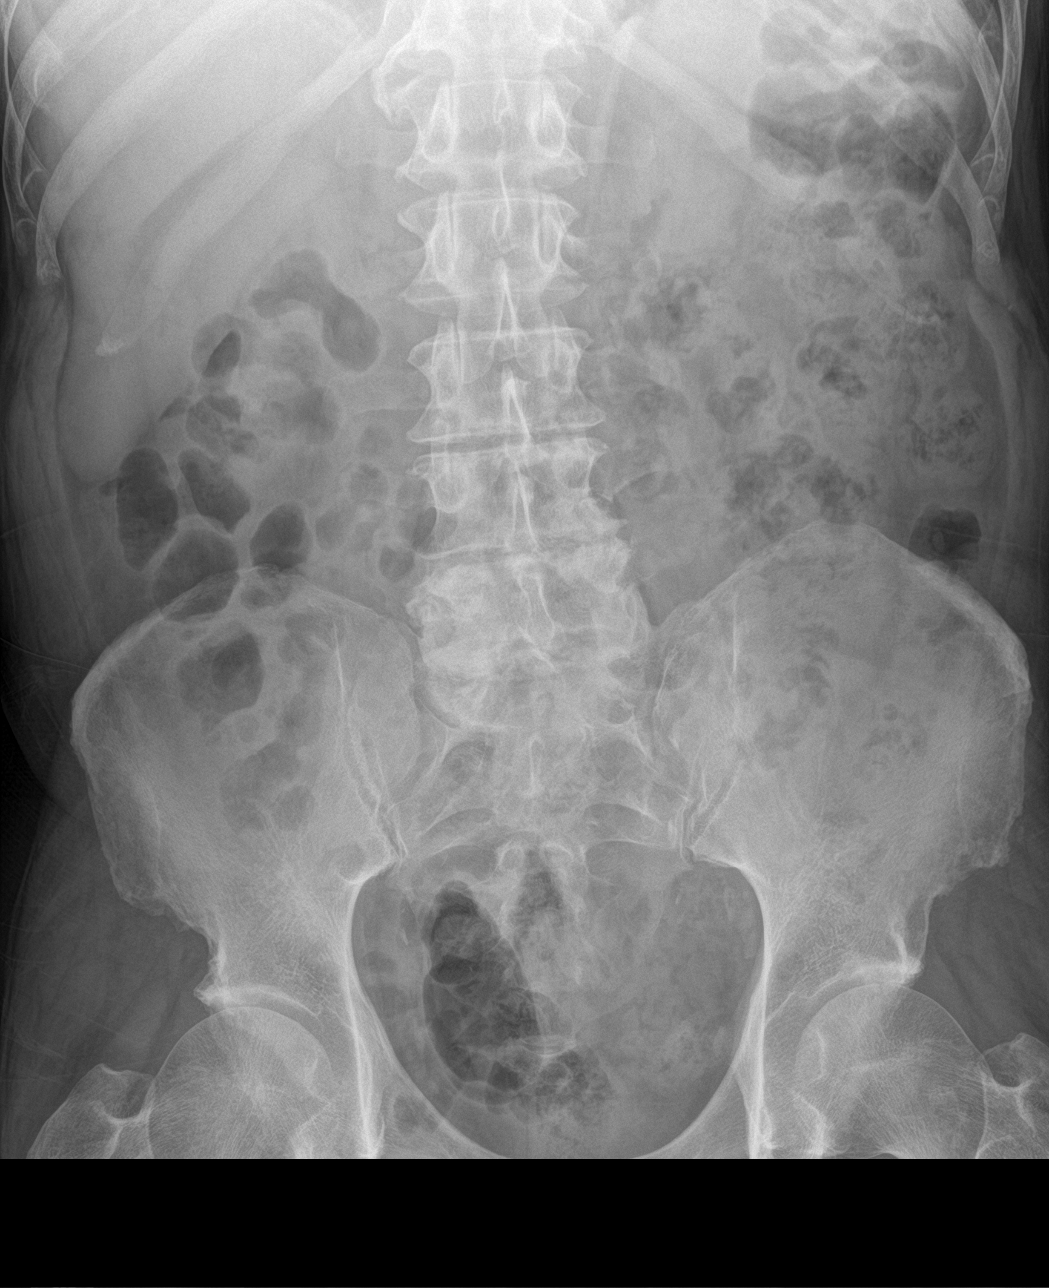

[abdomen supine (2 of 2)]
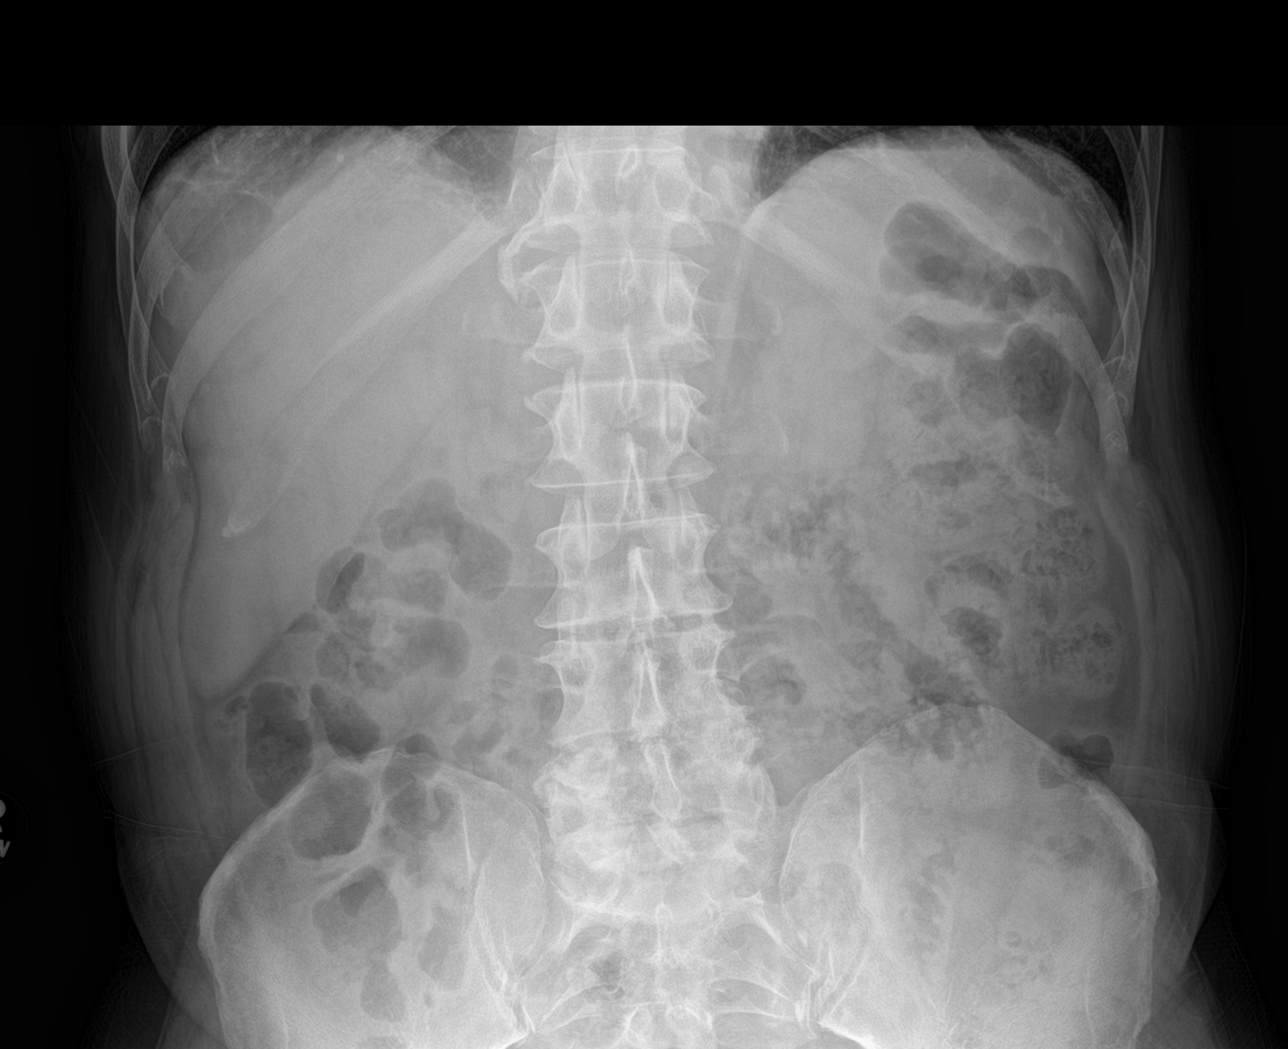

[3 of 3 positions shown; findings below may reference images not displayed]

FINDINGS: Soft tissue structures are unremarkable. No bowel distention. No
free air. Lung bases clear. Degenerative changes lumbar spine and
both hips.
IMPRESSION: No acute abnormality.

## 2016-05-18 ENCOUNTER — Encounter: Payer: Self-pay | Admitting: Family Medicine

## 2016-05-18 ENCOUNTER — Ambulatory Visit (INDEPENDENT_AMBULATORY_CARE_PROVIDER_SITE_OTHER): Payer: Medicare Other | Admitting: Family Medicine

## 2016-05-18 VITALS — BP 133/80 | HR 70 | Wt 163.0 lb

## 2016-05-18 DIAGNOSIS — I10 Essential (primary) hypertension: Secondary | ICD-10-CM

## 2016-05-18 DIAGNOSIS — J449 Chronic obstructive pulmonary disease, unspecified: Secondary | ICD-10-CM

## 2016-05-18 DIAGNOSIS — N400 Enlarged prostate without lower urinary tract symptoms: Secondary | ICD-10-CM

## 2016-05-18 MED ORDER — TAMSULOSIN HCL 0.4 MG PO CAPS: 0.4000 mg | ORAL_CAPSULE | Freq: Every day | ORAL | 1 refills | Status: DC

## 2016-05-18 MED ORDER — AMLODIPINE BESYLATE 5 MG PO TABS: ORAL_TABLET | ORAL | 1 refills | Status: DC

## 2016-05-18 MED ORDER — VALSARTAN-HYDROCHLOROTHIAZIDE 160-12.5 MG PO TABS: 1.0000 | ORAL_TABLET | Freq: Every day | ORAL | 1 refills | Status: DC

## 2016-05-18 NOTE — Progress Notes (Signed)
CC: Jeffrey Watson is a 75 y.o. male is here for Hypertension   Subjective: HPI:  Follow-up essential hypertension: He tells me that he is suspicious that lisinopril could be causing side effects such as cough and chest pain. He's been evaluated 2 times at a local hospital since I saw him last for chest pain and no source of the pain was found, he reported he had a normal stress test. He is currently not expressing any chest pain but he does have a daily cough. He denies any motor or sensory disturbances  Follow-up COPD: Provided he uses brie on a daily basis he denies any worsening cough from his baseline which is nonproductive. He denies any shortness of breath. Denies any blood in sputum  Follow-up BPH: Requesting refill of Flomax. Provided he takes this on a daily basis he denies any urinary frequency or urinary hesitancy. He has no genitourinary complaints today   Review Of Systems Outlined In HPI  Past Medical History:  Diagnosis Date  . Hypertension     Past Surgical History:  Procedure Laterality Date  . TONSILLECTOMY  1981   Family History  Problem Relation Age of Onset  . Stomach cancer      Grandma  . Heart attack Mother   . Stroke      Grandma    Social History   Social History  . Marital status: Unknown    Spouse name: N/A  . Number of children: N/A  . Years of education: N/A   Occupational History  . Not on file.   Social History Main Topics  . Smoking status: Current Every Day Smoker    Years: 60.00  . Smokeless tobacco: Not on file  . Alcohol use 3.0 oz/week    5 Standard drinks or equivalent per week  . Drug use: No  . Sexual activity: Not Currently   Other Topics Concern  . Not on file   Social History Narrative  . No narrative on file     Objective: BP 133/80   Pulse 70   Wt 163 lb (73.9 kg)   BMI 24.78 kg/m   General: Alert and Oriented, No Acute Distress HEENT: Pupils equal, round, reactive to light. Conjunctivae clear.  Moist mucous  membranes Lungs: Clear to auscultation bilaterally, no wheezing/ronchi/rales.  Comfortable work of breathing. Good air movement. Cardiac: Regular rate and rhythm. Normal S1/S2.  No murmurs, rubs, nor gallops.   Extremities: No peripheral edema.  Strong peripheral pulses.  Mental Status: No depression, anxiety, nor agitation. Skin: Warm and dry.  Assessment & Plan: Jeffrey Watson was seen today for hypertension.  Diagnoses and all orders for this visit:  Essential hypertension  COPD, mild (HCC)  BPH (benign prostatic hyperplasia)  Other orders -     valsartan-hydrochlorothiazide (DIOVAN HCT) 160-12.5 MG tablet; Take 1 tablet by mouth daily. Replaces lisinopril -     amLODipine (NORVASC) 5 MG tablet; TAKE ONE TABLET BY MOUTH ONCE DAILY -     tamsulosin (FLOMAX) 0.4 MG CAPS capsule; Take 1 capsule (0.4 mg total) by mouth daily.  Essential hypertension: Controlled however he is adamant about switching from lisinopril to a different medication. Continue amlodipine and begin Diovan HCT with stopping lisinopril-HCTZ COPD: Controlled continue Breo, call in a prescription/refill is desired BPH: Controlled with Flomax Flu shot was offered and encouraged however he politely declines  Return in about 4 months (around 09/17/2016) for BP Follow Up.

## 2016-05-26 ENCOUNTER — Other Ambulatory Visit: Payer: Self-pay | Admitting: Family Medicine

## 2016-06-07 ENCOUNTER — Other Ambulatory Visit: Payer: Self-pay | Admitting: *Deleted

## 2016-06-07 MED ORDER — RANITIDINE HCL 150 MG PO TABS: ORAL_TABLET | ORAL | 3 refills | Status: DC

## 2016-06-07 MED ORDER — AMLODIPINE BESYLATE 5 MG PO TABS: ORAL_TABLET | ORAL | 1 refills | Status: DC

## 2016-06-07 MED ORDER — TAMSULOSIN HCL 0.4 MG PO CAPS: 0.4000 mg | ORAL_CAPSULE | Freq: Every day | ORAL | 1 refills | Status: DC

## 2016-06-10 ENCOUNTER — Other Ambulatory Visit: Payer: Self-pay | Admitting: Physician Assistant

## 2016-10-20 ENCOUNTER — Other Ambulatory Visit: Payer: Self-pay | Admitting: Physician Assistant

## 2016-10-31 ENCOUNTER — Telehealth: Payer: Self-pay | Admitting: Family Medicine

## 2016-10-31 NOTE — Telephone Encounter (Signed)
I tried to call pt to let him know he is due for a f/u with a pcp and needs to switch his care to one of our other pcp's and his phone number WAS NOT working

## 2016-11-12 ENCOUNTER — Other Ambulatory Visit: Payer: Self-pay | Admitting: Physician Assistant

## 2016-12-05 ENCOUNTER — Other Ambulatory Visit: Payer: Self-pay | Admitting: Physician Assistant

## 2017-01-04 ENCOUNTER — Telehealth: Payer: Self-pay

## 2017-01-04 NOTE — Telephone Encounter (Signed)
FYI--Dr Madilyn Fireman, you signed a script for this patient, who was Dr. Lajoyce Lauber.  It was for albuterol and nebulizer supplies.  Dr. Lajoyce Lauber name and NPI # were still on the script, they are going to send it back for you to sign with your name and NPI#.

## 2017-01-20 ENCOUNTER — Other Ambulatory Visit: Payer: Self-pay | Admitting: Physician Assistant

## 2018-02-09 ENCOUNTER — Telehealth: Payer: Self-pay | Admitting: Cardiovascular Disease

## 2018-02-09 NOTE — Telephone Encounter (Signed)
Received incoming records from [facility] for up coming appointments on 02/13/2018 @ 9:00am with Dr Gwenlyn Found. Record given to [facility] in Medical Records 02/09/2018 [facility]

## 2018-02-13 ENCOUNTER — Ambulatory Visit: Payer: Medicare Other | Admitting: Cardiovascular Disease

## 2018-02-28 ENCOUNTER — Encounter: Payer: Self-pay | Admitting: Cardiovascular Disease

## 2018-02-28 ENCOUNTER — Ambulatory Visit: Payer: Medicare Other | Admitting: Cardiovascular Disease

## 2018-02-28 DIAGNOSIS — R9439 Abnormal result of other cardiovascular function study: Secondary | ICD-10-CM | POA: Insufficient documentation

## 2018-02-28 DIAGNOSIS — R0609 Other forms of dyspnea: Secondary | ICD-10-CM

## 2018-02-28 DIAGNOSIS — I10 Essential (primary) hypertension: Secondary | ICD-10-CM

## 2018-02-28 LAB — BASIC METABOLIC PANEL
BUN/Creatinine Ratio: 13 (ref 10–24)
BUN: 10 mg/dL (ref 8–27)
CHLORIDE: 102 mmol/L (ref 96–106)
CO2: 23 mmol/L (ref 20–29)
CREATININE: 0.8 mg/dL (ref 0.76–1.27)
Calcium: 9.6 mg/dL (ref 8.6–10.2)
GFR calc Af Amer: 101 mL/min/{1.73_m2} (ref >59)
GFR calc non Af Amer: 87 mL/min/{1.73_m2} (ref >59)
Glucose: 95 mg/dL (ref 65–99)
Potassium: 4.2 mmol/L (ref 3.5–5.2)
SODIUM: 141 mmol/L (ref 134–144)

## 2018-02-28 LAB — CBC WITH DIFFERENTIAL/PLATELET
BASOS ABS: 0 10*3/uL (ref 0.0–0.2)
Basos: 0 %
EOS (ABSOLUTE): 0.1 10*3/uL (ref 0.0–0.4)
EOS: 2 %
Hematocrit: 46.7 % (ref 37.5–51.0)
Hemoglobin: 14.9 g/dL (ref 13.0–17.7)
IMMATURE GRANULOCYTES: 0 %
Immature Grans (Abs): 0 10*3/uL (ref 0.0–0.1)
Lymphocytes Absolute: 1.6 10*3/uL (ref 0.7–3.1)
Lymphs: 27 %
MCH: 26.4 pg — ABNORMAL LOW (ref 26.6–33.0)
MCHC: 31.9 g/dL (ref 31.5–35.7)
MCV: 83 fL (ref 79–97)
MONOCYTES: 9 %
MONOS ABS: 0.5 10*3/uL (ref 0.1–0.9)
NEUTROS PCT: 62 %
Neutrophils Absolute: 3.8 10*3/uL (ref 1.4–7.0)
PLATELETS: 176 10*3/uL (ref 150–450)
RBC: 5.65 x10E6/uL (ref 4.14–5.80)
RDW: 14 % (ref 12.3–15.4)
WBC: 6 10*3/uL (ref 3.4–10.8)

## 2018-02-28 LAB — PROTIME-INR
INR: 1 (ref 0.8–1.2)
PROTHROMBIN TIME: 10.2 s (ref 9.1–12.0)

## 2018-02-28 LAB — APTT: APTT: 28 s (ref 24–33)

## 2018-02-28 NOTE — Patient Instructions (Signed)
   [facility] [address] [place] [address] Dept: [phone] Loc: [phone]  Damarion Mendizabal  02/28/2018  You are scheduled for a Cardiac Catheterization on Monday, June 17 with Dr. Quay Burow.  1. Please arrive at the [facility] (Main Entrance A) at [facility]: [address] [place], [place] [address] at 11:30 AM (two hours before your procedure to ensure your preparation). Free valet parking service is available.   Special note: Every effort is made to have your procedure done on time. Please understand that emergencies sometimes delay scheduled procedures.  2. Diet: Do not eat or drink anything after midnight prior to your procedure except sips of water to take medications.  3. Labs: Please have labs drawn in our office today.  4. Medication instructions in preparation for your procedure:  On the morning of your procedure, take any of your morning medicines.  You may use sips of water.  5. Plan for one night stay--bring personal belongings. 6. Bring a current list of your medications and current insurance cards. 7. You MUST have a responsible person to drive you home. 8. Someone MUST be with you the first 24 hours after you arrive home or your discharge will be delayed. 9. Please wear clothes that are easy to get on and off and wear slip-on shoes.  Thank you for allowing [place] to care for you!   -- [facility]

## 2018-02-28 NOTE — Assessment & Plan Note (Addendum)
Patient was referred to me by Dr. Claudie Leach for dyspnea on exertion.  He has a long history of tobacco abuse continue to smoke a pack a day.  He said increasing dyspnea on exertion over the last 3 months and apparently recently had a Myoview stress test and a 2D echo cardiogram performed by Dr. Claudie Leach.  I spoke to Dr. Claudie Leach who said his echo and Myoview were unremarkable although because of his ongoing symptoms for unclear reasons he preferred to have a diagnostic coronary angiogram to define his anatomy and rule out an ischemic etiology.Jeffrey Watson

## 2018-02-28 NOTE — H&P (View-Only) (Signed)
02/28/2018 Jeffrey Watson   April 03, 1941  [id number]  Primary Physician System, Provider Not In Primary Cardiologist: Lorretta Harp MD Lupe Carney, [place]  HPI:  Jeffrey Watson is a 77 y.o. mildly overweight married African-American male father of 5 children who is accompanied by his wife Jeffrey Watson today.  He was referred by Dr. Claudie Leach for cardiac catheterization because of a 34-month history of increasing dyspnea on exertion, chest pain and an abnormal stress test.  He is currently retired.  He worked for a [employer].  His risk factors include greater than 60 pack years of tobacco abuse as well as treated hypertension.  He continues to smoke 1 pack a day.  His mother did have a myocardial infarction.  He is never had a heart attack or stroke.  He is noticed increasing dyspnea for the last 3 months along with chest pain occurring several times a day.  He apparently had an ultrasound of his heart and a Myoview stress test which were abnormal prompting the referral for invasive imaging.   No outpatient medications have been marked as taking for the 02/28/18 encounter (Office Visit) with Lorretta Harp, MD.     No Known Allergies  Social History   Socioeconomic History  . Marital status: Unknown    Spouse name: Not on file  . Number of children: Not on file  . Years of education: Not on file  . Highest education level: Not on file  Occupational History  . Not on file  Social Needs  . Financial resource strain: Not on file  . Food insecurity:    Worry: Not on file    Inability: Not on file  . Transportation needs:    Medical: Not on file    Non-medical: Not on file  Tobacco Use  . Smoking status: Current Every Day Smoker    Years: 60.00  . Smokeless tobacco: Never Used  Substance and Sexual Activity  . Alcohol use: Yes    Alcohol/week: 3.0 oz    Types: 5 Standard drinks or equivalent per week  . Drug use: No  . Sexual activity: Not Currently  Lifestyle  . Physical  activity:    Days per week: Not on file    Minutes per session: Not on file  . Stress: Not on file  Relationships  . Social connections:    Talks on phone: Not on file    Gets together: Not on file    Attends religious service: Not on file    Active member of club or organization: Not on file    Attends meetings of clubs or organizations: Not on file    Relationship status: Not on file  . Intimate partner violence:    Fear of current or ex partner: Not on file    Emotionally abused: Not on file    Physically abused: Not on file    Forced sexual activity: Not on file  Other Topics Concern  . Not on file  Social History Narrative  . Not on file     Review of Systems: General: negative for chills, fever, night sweats or weight changes.  Cardiovascular: negative for chest pain, dyspnea on exertion, edema, orthopnea, palpitations, paroxysmal nocturnal dyspnea or shortness of breath Dermatological: negative for rash Respiratory: negative for cough or wheezing Urologic: negative for hematuria Abdominal: negative for nausea, vomiting, diarrhea, bright red blood per rectum, melena, or hematemesis Neurologic: negative for visual changes, syncope, or dizziness All other systems reviewed and  are otherwise negative except as noted above.    Blood pressure (!) 162/86, pulse 76, height 5\' 8"  (1.727 m), weight 167 lb 3.2 oz (75.8 kg).  General appearance: alert and no distress Neck: no adenopathy, no carotid bruit, no JVD, supple, symmetrical, trachea midline and thyroid not enlarged, symmetric, no tenderness/mass/nodules Lungs: clear to auscultation bilaterally Heart: regular rate and rhythm, S1, S2 normal, no murmur, click, rub or gallop Extremities: extremities normal, atraumatic, no cyanosis or edema Pulses: 2+ and symmetric Skin: Skin color, texture, turgor normal. No rashes or lesions Neurologic: Alert and oriented X 3, normal strength and tone. Normal symmetric reflexes. Normal  coordination and gait  EKG sinus rhythm at 76 without ST or T wave changes.  I personally reviewed this EKG.  ASSESSMENT AND PLAN:   Essential hypertension History of essential hypertension with blood pressure measured today at 162/86.  He is on valsartan , amlodipine and hydrochlorothiazide.  Dyspnea on exertion Patient was referred to me by Dr. Claudie Leach for dyspnea on exertion.  He has a long history of tobacco abuse continue to smoke a pack a day.  He said increasing dyspnea on exertion over the last 3 months and apparently recently had a Myoview stress test and a 2D echo cardiogram performed by Dr. Claudie Leach.  I spoke to Dr. Claudie Leach who said his echo and Myoview were unremarkable although because of his ongoing symptoms for unclear reasons he preferred to have a diagnostic coronary angiogram to define his anatomy and rule out an ischemic etiology.Lorretta Harp MD FACP,FACC,FAHA, [facility] 02/28/2018 11:02 AM

## 2018-02-28 NOTE — Assessment & Plan Note (Signed)
History of essential hypertension with blood pressure measured today at 162/86.  He is on valsartan , amlodipine and hydrochlorothiazide.

## 2018-02-28 NOTE — Progress Notes (Signed)
02/28/2018 Jeffrey Watson   April 15, 1941  [id number]  Primary Physician System, Provider Not In Primary Cardiologist: Lorretta Harp MD Jeffrey Watson, [place]  HPI:  Jeffrey Watson is a 76 y.o. mildly overweight married African-American male father of 5 children who is accompanied by his wife Jeffrey Watson today.  He was referred by Dr. Claudie Leach for cardiac catheterization because of a 33-month history of increasing dyspnea on exertion, chest pain and an abnormal stress test.  He is currently retired.  He worked for a [employer].  His risk factors include greater than 60 pack years of tobacco abuse as well as treated hypertension.  He continues to smoke 1 pack a day.  His mother did have a myocardial infarction.  He is never had a heart attack or stroke.  He is noticed increasing dyspnea for the last 3 months along with chest pain occurring several times a day.  He apparently had an ultrasound of his heart and a Myoview stress test which were abnormal prompting the referral for invasive imaging.   No outpatient medications have been marked as taking for the 02/28/18 encounter (Office Visit) with Lorretta Harp, MD.     No Known Allergies  Social History   Socioeconomic History  . Marital status: Unknown    Spouse name: Not on file  . Number of children: Not on file  . Years of education: Not on file  . Highest education level: Not on file  Occupational History  . Not on file  Social Needs  . Financial resource strain: Not on file  . Food insecurity:    Worry: Not on file    Inability: Not on file  . Transportation needs:    Medical: Not on file    Non-medical: Not on file  Tobacco Use  . Smoking status: Current Every Day Smoker    Years: 60.00  . Smokeless tobacco: Never Used  Substance and Sexual Activity  . Alcohol use: Yes    Alcohol/week: 3.0 oz    Types: 5 Standard drinks or equivalent per week  . Drug use: No  . Sexual activity: Not Currently  Lifestyle  . Physical  activity:    Days per week: Not on file    Minutes per session: Not on file  . Stress: Not on file  Relationships  . Social connections:    Talks on phone: Not on file    Gets together: Not on file    Attends religious service: Not on file    Active member of club or organization: Not on file    Attends meetings of clubs or organizations: Not on file    Relationship status: Not on file  . Intimate partner violence:    Fear of current or ex partner: Not on file    Emotionally abused: Not on file    Physically abused: Not on file    Forced sexual activity: Not on file  Other Topics Concern  . Not on file  Social History Narrative  . Not on file     Review of Systems: General: negative for chills, fever, night sweats or weight changes.  Cardiovascular: negative for chest pain, dyspnea on exertion, edema, orthopnea, palpitations, paroxysmal nocturnal dyspnea or shortness of breath Dermatological: negative for rash Respiratory: negative for cough or wheezing Urologic: negative for hematuria Abdominal: negative for nausea, vomiting, diarrhea, bright red blood per rectum, melena, or hematemesis Neurologic: negative for visual changes, syncope, or dizziness All other systems reviewed and  are otherwise negative except as noted above.    Blood pressure (!) 162/86, pulse 76, height 5\' 8"  (1.727 m), weight 167 lb 3.2 oz (75.8 kg).  General appearance: alert and no distress Neck: no adenopathy, no carotid bruit, no JVD, supple, symmetrical, trachea midline and thyroid not enlarged, symmetric, no tenderness/mass/nodules Lungs: clear to auscultation bilaterally Heart: regular rate and rhythm, S1, S2 normal, no murmur, click, rub or gallop Extremities: extremities normal, atraumatic, no cyanosis or edema Pulses: 2+ and symmetric Skin: Skin color, texture, turgor normal. No rashes or lesions Neurologic: Alert and oriented X 3, normal strength and tone. Normal symmetric reflexes. Normal  coordination and gait  EKG sinus rhythm at 76 without ST or T wave changes.  I personally reviewed this EKG.  ASSESSMENT AND PLAN:   Essential hypertension History of essential hypertension with blood pressure measured today at 162/86.  He is on valsartan , amlodipine and hydrochlorothiazide.  Dyspnea on exertion Patient was referred to me by Dr. Claudie Leach for dyspnea on exertion.  He has a long history of tobacco abuse continue to smoke a pack a day.  He said increasing dyspnea on exertion over the last 3 months and apparently recently had a Myoview stress test and a 2D echo cardiogram performed by Dr. Claudie Leach.  I spoke to Dr. Claudie Leach who said his echo and Myoview were unremarkable although because of his ongoing symptoms for unclear reasons he preferred to have a diagnostic coronary angiogram to define his anatomy and rule out an ischemic etiology.Lorretta Harp MD FACP,FACC,FAHA, [facility] 02/28/2018 11:02 AM

## 2018-03-01 ENCOUNTER — Telehealth: Payer: Self-pay | Admitting: *Deleted

## 2018-03-01 NOTE — Telephone Encounter (Signed)
Pt contacted pre-catheterization scheduled at [facility] for: Monday March 05, 2018 1:30 PM Verified arrival time and place: [facility] at: 11:30 AM  No solid food after midnight prior to cath, clear liquids until 5 AM day of procedure. Verified allergies in Epic Verified no diabetes medications.  AM meds can be  taken pre-cath with sip of water including: ASA 81 mg    Confirmed patient has responsible person to drive home post procedure and observe patient for 24 hours: yes

## 2018-03-05 ENCOUNTER — Encounter (HOSPITAL_COMMUNITY): Payer: Self-pay | Admitting: *Deleted

## 2018-03-05 ENCOUNTER — Ambulatory Visit (HOSPITAL_COMMUNITY)
Admission: RE | Admit: 2018-03-05 | Discharge: 2018-03-05 | Disposition: A | Discharge status: Home / Self Care | Payer: Medicare Other | Source: Ambulatory Visit | Attending: Cardiovascular Disease | Admitting: Cardiovascular Disease

## 2018-03-05 ENCOUNTER — Ambulatory Visit (HOSPITAL_COMMUNITY)
Admission: RE | Disposition: A | Discharge status: Home / Self Care | Payer: Self-pay | Source: Ambulatory Visit | Attending: Cardiovascular Disease

## 2018-03-05 DIAGNOSIS — I10 Essential (primary) hypertension: Secondary | ICD-10-CM | POA: Insufficient documentation

## 2018-03-05 DIAGNOSIS — R0789 Other chest pain: Secondary | ICD-10-CM

## 2018-03-05 DIAGNOSIS — Z8249 Family history of ischemic heart disease and other diseases of the circulatory system: Secondary | ICD-10-CM | POA: Diagnosis not present

## 2018-03-05 DIAGNOSIS — F17210 Nicotine dependence, cigarettes, uncomplicated: Secondary | ICD-10-CM | POA: Diagnosis not present

## 2018-03-05 DIAGNOSIS — R0609 Other forms of dyspnea: Secondary | ICD-10-CM | POA: Diagnosis present

## 2018-03-05 DIAGNOSIS — R9439 Abnormal result of other cardiovascular function study: Secondary | ICD-10-CM | POA: Diagnosis present

## 2018-03-05 HISTORY — PX: LEFT HEART CATH AND CORONARY ANGIOGRAPHY: CATH118249

## 2018-03-05 SURGERY — LEFT HEART CATH AND CORONARY ANGIOGRAPHY
Anesthesia: LOCAL

## 2018-03-05 MED ORDER — SODIUM CHLORIDE 0.9% FLUSH: 3.0000 mL | INTRAVENOUS | Status: DC | PRN

## 2018-03-05 MED ORDER — LIDOCAINE HCL (PF) 1 % IJ SOLN
INTRAMUSCULAR | Status: AC
  Filled 2018-03-05: qty 30

## 2018-03-05 MED ORDER — IPRATROPIUM BROMIDE 0.02 % IN SOLN
RESPIRATORY_TRACT | Status: AC
  Administered 2018-03-05: 0.5 mg
  Filled 2018-03-05: qty 2.5

## 2018-03-05 MED ORDER — SODIUM CHLORIDE 0.9% FLUSH: 3.0000 mL | Freq: Two times a day (BID) | INTRAVENOUS | Status: DC

## 2018-03-05 MED ORDER — SODIUM CHLORIDE 0.9 % WEIGHT BASED INFUSION: 1.0000 mL/kg/h | INTRAVENOUS | Status: DC

## 2018-03-05 MED ORDER — IPRATROPIUM-ALBUTEROL 0.5-2.5 (3) MG/3ML IN SOLN
3.0000 mL | Freq: Once | RESPIRATORY_TRACT | Status: AC
  Filled 2018-03-05: qty 3

## 2018-03-05 MED ORDER — HEPARIN SODIUM (PORCINE) 1000 UNIT/ML IJ SOLN
INTRAMUSCULAR | Status: DC | PRN
  Administered 2018-03-05: 4000 [IU] via INTRAVENOUS

## 2018-03-05 MED ORDER — ONDANSETRON HCL 4 MG/2ML IJ SOLN: 4.0000 mg | Freq: Four times a day (QID) | INTRAMUSCULAR | Status: DC | PRN

## 2018-03-05 MED ORDER — FENTANYL CITRATE (PF) 100 MCG/2ML IJ SOLN
INTRAMUSCULAR | Status: AC
Start: 2018-03-05 — End: ?
  Filled 2018-03-05: qty 2

## 2018-03-05 MED ORDER — HEPARIN (PORCINE) IN NACL 2-0.9 UNITS/ML
INTRAMUSCULAR | Status: AC | PRN
  Administered 2018-03-05: 1000 mL

## 2018-03-05 MED ORDER — IOHEXOL 350 MG/ML SOLN
INTRAVENOUS | Status: DC | PRN
  Administered 2018-03-05: 60 mL via INTRA_ARTERIAL

## 2018-03-05 MED ORDER — ASPIRIN 81 MG PO CHEW: 81.0000 mg | CHEWABLE_TABLET | ORAL | Status: DC

## 2018-03-05 MED ORDER — LIDOCAINE HCL (PF) 1 % IJ SOLN
INTRAMUSCULAR | Status: DC | PRN
  Administered 2018-03-05: 2 mL

## 2018-03-05 MED ORDER — NITROGLYCERIN 1 MG/10 ML FOR IR/CATH LAB
INTRA_ARTERIAL | Status: AC
  Filled 2018-03-05: qty 10

## 2018-03-05 MED ORDER — HEPARIN (PORCINE) IN NACL 1000-0.9 UT/500ML-% IV SOLN
INTRAVENOUS | Status: AC
  Filled 2018-03-05: qty 1000

## 2018-03-05 MED ORDER — VERAPAMIL HCL 2.5 MG/ML IV SOLN
INTRAVENOUS | Status: DC | PRN
  Administered 2018-03-05: 10 mL via INTRA_ARTERIAL

## 2018-03-05 MED ORDER — SODIUM CHLORIDE 0.9 % IV SOLN: 250.0000 mL | INTRAVENOUS | Status: DC | PRN

## 2018-03-05 MED ORDER — FENTANYL CITRATE (PF) 100 MCG/2ML IJ SOLN
INTRAMUSCULAR | Status: DC | PRN
  Administered 2018-03-05: 25 ug via INTRAVENOUS

## 2018-03-05 MED ORDER — SODIUM CHLORIDE 0.9 % WEIGHT BASED INFUSION
3.0000 mL/kg/h | INTRAVENOUS | Status: AC
  Administered 2018-03-05: 3 mL/kg/h via INTRAVENOUS

## 2018-03-05 MED ORDER — ALBUTEROL SULFATE (2.5 MG/3ML) 0.083% IN NEBU
INHALATION_SOLUTION | RESPIRATORY_TRACT | Status: AC
  Administered 2018-03-05: 2.5 mg
  Filled 2018-03-05: qty 3

## 2018-03-05 MED ORDER — MORPHINE SULFATE (PF) 10 MG/ML IV SOLN: 2.0000 mg | INTRAVENOUS | Status: DC | PRN

## 2018-03-05 MED ORDER — VERAPAMIL HCL 2.5 MG/ML IV SOLN
INTRAVENOUS | Status: AC
  Filled 2018-03-05: qty 2

## 2018-03-05 MED ORDER — SODIUM CHLORIDE 0.9 % IV SOLN: INTRAVENOUS | Status: DC

## 2018-03-05 MED ORDER — ACETAMINOPHEN 325 MG PO TABS: 650.0000 mg | ORAL_TABLET | ORAL | Status: DC | PRN

## 2018-03-05 MED ORDER — HEPARIN SODIUM (PORCINE) 1000 UNIT/ML IJ SOLN
INTRAMUSCULAR | Status: AC
  Filled 2018-03-05: qty 1

## 2018-03-05 SURGICAL SUPPLY — 12 items

## 2018-03-05 NOTE — Discharge Instructions (Signed)

## 2018-03-05 NOTE — Interval H&P Note (Signed)
Cath Lab Visit (complete for each Cath Lab visit)  Clinical Evaluation Leading to the Procedure:   ACS: No.  Non-ACS:    Anginal Classification: CCS II  Anti-ischemic medical therapy: No Therapy  Non-Invasive Test Results: Low-risk stress test findings: cardiac mortality <1%/year  Prior CABG: No previous CABG      History and Physical Interval Note:  03/05/2018 2:01 PM  Jeffrey Watson  has presented today for surgery, with the diagnosis of abnormal stress test, dyspnea  The various methods of treatment have been discussed with the patient and family. After consideration of risks, benefits and other options for treatment, the patient has consented to  Procedure(s): LEFT HEART CATH AND CORONARY ANGIOGRAPHY (N/A) as a surgical intervention .  The patient's history has been reviewed, patient examined, no change in status, stable for surgery.  I have reviewed the patient's chart and labs.  Questions were answered to the patient's satisfaction.     Quay Burow

## 2018-03-05 NOTE — Research (Signed)
CADFEM Informed Consent   Subject Name: Jeffrey Watson  Subject met inclusion and exclusion criteria.  The informed consent form, study requirements and expectations were reviewed with the subject and questions and concerns were addressed prior to the signing of the consent form.  The subject verbalized understanding of the trail requirements.  The subject agreed to participate in the CADFEM trial and signed the informed consent.  The informed consent was obtained prior to performance of any protocol-specific procedures for the subject.  A copy of the signed informed consent was given to the subject and a copy was placed in the subject's medical record.  Christena Flake 03/05/2018, 12:45 PM

## 2018-03-06 ENCOUNTER — Encounter (HOSPITAL_COMMUNITY): Payer: Self-pay | Admitting: Cardiovascular Disease

## 2018-03-06 MED FILL — Heparin Sod (Porcine)-NaCl IV Soln 1000 Unit/500ML-0.9%: INTRAVENOUS | Qty: 1000 | Status: AC

## 2018-03-06 MED FILL — Nitroglycerin IV Soln 100 MCG/ML in D5W: INTRA_ARTERIAL | Qty: 10 | Status: AC

## 2018-08-18 DIAGNOSIS — F17200 Nicotine dependence, unspecified, uncomplicated: Secondary | ICD-10-CM | POA: Diagnosis present

## 2019-09-10 DIAGNOSIS — J432 Centrilobular emphysema: Secondary | ICD-10-CM | POA: Diagnosis present

## 2021-09-02 DIAGNOSIS — C3491 Malignant neoplasm of unspecified part of right bronchus or lung: Secondary | ICD-10-CM | POA: Diagnosis present

## 2021-10-06 DIAGNOSIS — C3490 Malignant neoplasm of unspecified part of unspecified bronchus or lung: Secondary | ICD-10-CM | POA: Diagnosis present

## 2022-06-20 DIAGNOSIS — Z91199 Patient's noncompliance with other medical treatment and regimen due to unspecified reason: Secondary | ICD-10-CM | POA: Insufficient documentation

## 2022-06-20 DIAGNOSIS — J449 Chronic obstructive pulmonary disease, unspecified: Secondary | ICD-10-CM | POA: Insufficient documentation

## 2022-06-20 DIAGNOSIS — J9621 Acute and chronic respiratory failure with hypoxia: Secondary | ICD-10-CM | POA: Diagnosis present

## 2022-07-04 ENCOUNTER — Other Ambulatory Visit: Payer: Self-pay

## 2022-07-04 ENCOUNTER — Emergency Department (HOSPITAL_COMMUNITY): Payer: Medicare Other

## 2022-07-04 ENCOUNTER — Encounter (HOSPITAL_COMMUNITY): Payer: Self-pay

## 2022-07-04 ENCOUNTER — Emergency Department (HOSPITAL_COMMUNITY)
Admission: EM | Admit: 2022-07-04 | Discharge: 2022-07-04 | Disposition: A | Discharge status: Home / Self Care | Payer: Medicare Other | Attending: Emergency Medicine | Admitting: Emergency Medicine

## 2022-07-04 DIAGNOSIS — Z79899 Other long term (current) drug therapy: Secondary | ICD-10-CM | POA: Insufficient documentation

## 2022-07-04 DIAGNOSIS — I10 Essential (primary) hypertension: Secondary | ICD-10-CM | POA: Diagnosis not present

## 2022-07-04 DIAGNOSIS — R1084 Generalized abdominal pain: Secondary | ICD-10-CM | POA: Diagnosis present

## 2022-07-04 DIAGNOSIS — Z85118 Personal history of other malignant neoplasm of bronchus and lung: Secondary | ICD-10-CM

## 2022-07-04 DIAGNOSIS — R188 Other ascites: Secondary | ICD-10-CM | POA: Diagnosis not present

## 2022-07-04 HISTORY — DX: Malignant (primary) neoplasm, unspecified: C80.1

## 2022-07-04 HISTORY — DX: Pneumonia, unspecified organism: J18.9

## 2022-07-04 LAB — CBC WITH DIFFERENTIAL/PLATELET
Abs Immature Granulocytes: 0.05 10*3/uL (ref 0.00–0.07)
Basophils Absolute: 0 10*3/uL (ref 0.0–0.1)
Basophils Relative: 0 %
Eosinophils Absolute: 0.1 10*3/uL (ref 0.0–0.5)
Eosinophils Relative: 1 %
HCT: 35.3 % — ABNORMAL LOW (ref 39.0–52.0)
Hemoglobin: 10.8 g/dL — ABNORMAL LOW (ref 13.0–17.0)
Immature Granulocytes: 1 %
Lymphocytes Relative: 14 %
Lymphs Abs: 1 10*3/uL (ref 0.7–4.0)
MCH: 24.3 pg — ABNORMAL LOW (ref 26.0–34.0)
MCHC: 30.6 g/dL (ref 30.0–36.0)
MCV: 79.5 fL — ABNORMAL LOW (ref 80.0–100.0)
Monocytes Absolute: 0.7 10*3/uL (ref 0.1–1.0)
Monocytes Relative: 10 %
Neutro Abs: 5 10*3/uL (ref 1.7–7.7)
Neutrophils Relative %: 74 %
Platelets: 351 10*3/uL (ref 150–400)
RBC: 4.44 MIL/uL (ref 4.22–5.81)
RDW: 14.6 % (ref 11.5–15.5)
WBC: 6.8 10*3/uL (ref 4.0–10.5)
nRBC: 0 % (ref 0.0–0.2)

## 2022-07-04 LAB — COMPREHENSIVE METABOLIC PANEL
ALT: 7 U/L (ref 0–44)
AST: 17 U/L (ref 15–41)
Albumin: 3 g/dL — ABNORMAL LOW (ref 3.5–5.0)
Alkaline Phosphatase: 58 U/L (ref 38–126)
Anion gap: 6 (ref 5–15)
BUN: 9 mg/dL (ref 8–23)
CO2: 27 mmol/L (ref 22–32)
Calcium: 8.7 mg/dL — ABNORMAL LOW (ref 8.9–10.3)
Chloride: 99 mmol/L (ref 98–111)
Creatinine, Ser: 1.03 mg/dL (ref 0.61–1.24)
GFR, Estimated: >60 mL/min (ref >60)
Glucose, Bld: 119 mg/dL — ABNORMAL HIGH (ref 70–99)
Potassium: 4.4 mmol/L (ref 3.5–5.1)
Sodium: 132 mmol/L — ABNORMAL LOW (ref 135–145)
Total Bilirubin: 0.4 mg/dL (ref 0.3–1.2)
Total Protein: 7.1 g/dL (ref 6.5–8.1)

## 2022-07-04 MED ORDER — OXYCODONE HCL 5 MG PO TABS: 5.0000 mg | ORAL_TABLET | Freq: Four times a day (QID) | ORAL | 0 refills | Status: AC | PRN

## 2022-07-04 MED ORDER — IOHEXOL 300 MG/ML  SOLN
80.0000 mL | Freq: Once | INTRAMUSCULAR | Status: AC | PRN
  Administered 2022-07-04: 80 mL via INTRAVENOUS

## 2022-07-04 MED ORDER — MORPHINE SULFATE (PF) 2 MG/ML IV SOLN
2.0000 mg | Freq: Once | INTRAVENOUS | Status: AC
  Administered 2022-07-04: 2 mg via INTRAVENOUS
  Filled 2022-07-04: qty 1

## 2022-07-04 MED ORDER — OXYCODONE-ACETAMINOPHEN 5-325 MG PO TABS
1.0000 | ORAL_TABLET | Freq: Once | ORAL | Status: AC
  Administered 2022-07-04: 1 via ORAL
  Filled 2022-07-04: qty 1

## 2022-07-04 MED ORDER — IOHEXOL 9 MG/ML PO SOLN
500.0000 mL | ORAL | Status: AC
  Administered 2022-07-04: 500 mL via ORAL

## 2022-07-04 MED ORDER — ONDANSETRON HCL 4 MG/2ML IJ SOLN
4.0000 mg | Freq: Once | INTRAMUSCULAR | Status: AC
  Administered 2022-07-04: 4 mg via INTRAVENOUS
  Filled 2022-07-04: qty 2

## 2022-07-04 MED ORDER — IOHEXOL 9 MG/ML PO SOLN
ORAL | Status: AC
  Filled 2022-07-04: qty 1000

## 2022-07-04 NOTE — Discharge Instructions (Addendum)
Today you were seen in the emergency department for your abdominal pain.    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    At home, please use the oxycodone we have prescribed you as needed for any pain that you may have.    Check your MyChart online for the results of any tests that had not resulted by the time you left the emergency department.   Follow-up with your primary doctor in 2-3 days regarding your visit.  Call to schedule an appointment with your surgeon and oncologist as soon as possible to discuss your symptoms.   Return immediately to the emergency department if you experience any of the following: worsening pain, fevers, vomiting, or any other concerning symptoms.    Thank you for visiting our Emergency Department. It was a pleasure taking care of you today.
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
## 2022-07-04 NOTE — ED Notes (Signed)
Pt returned from CT °

## 2022-07-04 NOTE — ED Triage Notes (Signed)
Per EMS- patient c/o abdominal pain that started today. Patient reports that he had a hernia repair approx 2.5 weeks ago. Patient wears home O2 2L/min via [place]

## 2022-07-04 NOTE — ED Provider Triage Note (Signed)
Emergency Medicine Provider Triage Evaluation Note  Jeffrey Watson , a 81 y.o. male  was evaluated in triage.  Pt complains of right inguinal pain.  Patient states he had a hernia repair on 8 September.  He notes persistent pain since the surgery without relief from pain medicine prescribed at home.  He notes no improvement of pain since first day postop.  He states any bearing down movement including urinating, coughing, having bowel movement increases pain.  Denies fever, chills, night sweats, nausea, vomiting, changes in bowel habits, medic easier, melena..  Review of Systems  Positive: See above Negative:  Physical Exam  BP 125/67   Pulse 96   Temp 98.5 F (36.9 C) (Oral)   Resp 20   Ht 5\' 7"  (1.702 m)   Wt 59 kg   SpO2 97%   BMI 20.36 kg/m  Gen:   Awake, no distress   Resp:  Normal effort  MSK:   Moves extremities without difficulty  Other:  Right inguinal region extremely tender to the touch.  Left inguinal hernia noticed.  Medical Decision Making  Medically screening exam initiated at 12:42 PM.  Appropriate orders placed.  Alexy Heldt was informed that the remainder of the evaluation will be completed by another provider, this initial triage assessment does not replace that evaluation, and the importance of remaining in the ED until their evaluation is complete.     Wilnette Kales, [place] 07/04/22 1244

## 2022-07-04 NOTE — ED Notes (Signed)
Patient transported to CT 

## 2022-07-04 NOTE — ED Notes (Signed)
Pt took his O2 off and was outside smoking

## 2022-07-15 ENCOUNTER — Inpatient Hospital Stay (HOSPITAL_COMMUNITY)
Admission: EM | Admit: 2022-07-15 | Discharge: 2022-07-25 | DRG: 871 | Disposition: A | Discharge status: Hospice | Payer: Medicare Other | Attending: Internal Medicine | Admitting: Internal Medicine

## 2022-07-15 ENCOUNTER — Inpatient Hospital Stay (HOSPITAL_COMMUNITY): Payer: Medicare Other

## 2022-07-15 ENCOUNTER — Encounter (HOSPITAL_COMMUNITY): Payer: Self-pay | Admitting: Emergency Medicine

## 2022-07-15 ENCOUNTER — Emergency Department (HOSPITAL_COMMUNITY): Payer: Medicare Other

## 2022-07-15 DIAGNOSIS — J9621 Acute and chronic respiratory failure with hypoxia: Secondary | ICD-10-CM | POA: Diagnosis present

## 2022-07-15 DIAGNOSIS — Z880 Allergy status to penicillin: Secondary | ICD-10-CM

## 2022-07-15 DIAGNOSIS — R7989 Other specified abnormal findings of blood chemistry: Secondary | ICD-10-CM | POA: Diagnosis not present

## 2022-07-15 DIAGNOSIS — Z9981 Dependence on supplemental oxygen: Secondary | ICD-10-CM

## 2022-07-15 DIAGNOSIS — R6520 Severe sepsis without septic shock: Secondary | ICD-10-CM | POA: Diagnosis not present

## 2022-07-15 DIAGNOSIS — Z8249 Family history of ischemic heart disease and other diseases of the circulatory system: Secondary | ICD-10-CM

## 2022-07-15 DIAGNOSIS — K209 Esophagitis, unspecified without bleeding: Secondary | ICD-10-CM

## 2022-07-15 DIAGNOSIS — R627 Adult failure to thrive: Secondary | ICD-10-CM

## 2022-07-15 DIAGNOSIS — J189 Pneumonia, unspecified organism: Secondary | ICD-10-CM

## 2022-07-15 DIAGNOSIS — C3490 Malignant neoplasm of unspecified part of unspecified bronchus or lung: Secondary | ICD-10-CM | POA: Diagnosis present

## 2022-07-15 DIAGNOSIS — R64 Cachexia: Secondary | ICD-10-CM | POA: Diagnosis present

## 2022-07-15 DIAGNOSIS — C787 Secondary malignant neoplasm of liver and intrahepatic bile duct: Secondary | ICD-10-CM | POA: Diagnosis present

## 2022-07-15 DIAGNOSIS — J9622 Acute and chronic respiratory failure with hypercapnia: Secondary | ICD-10-CM | POA: Diagnosis present

## 2022-07-15 DIAGNOSIS — F4320 Adjustment disorder, unspecified: Secondary | ICD-10-CM | POA: Diagnosis present

## 2022-07-15 DIAGNOSIS — A419 Sepsis, unspecified organism: Secondary | ICD-10-CM | POA: Diagnosis present

## 2022-07-15 DIAGNOSIS — D6959 Other secondary thrombocytopenia: Secondary | ICD-10-CM | POA: Diagnosis present

## 2022-07-15 DIAGNOSIS — Z681 Body mass index (BMI) 19 or less, adult: Secondary | ICD-10-CM

## 2022-07-15 DIAGNOSIS — J432 Centrilobular emphysema: Secondary | ICD-10-CM | POA: Diagnosis present

## 2022-07-15 DIAGNOSIS — J441 Chronic obstructive pulmonary disease with (acute) exacerbation: Secondary | ICD-10-CM | POA: Diagnosis not present

## 2022-07-15 DIAGNOSIS — E870 Hyperosmolality and hypernatremia: Secondary | ICD-10-CM | POA: Diagnosis present

## 2022-07-15 DIAGNOSIS — R180 Malignant ascites: Secondary | ICD-10-CM | POA: Diagnosis present

## 2022-07-15 DIAGNOSIS — J449 Chronic obstructive pulmonary disease, unspecified: Secondary | ICD-10-CM | POA: Diagnosis present

## 2022-07-15 DIAGNOSIS — D61810 Antineoplastic chemotherapy induced pancytopenia: Secondary | ICD-10-CM | POA: Diagnosis present

## 2022-07-15 DIAGNOSIS — N400 Enlarged prostate without lower urinary tract symptoms: Secondary | ICD-10-CM | POA: Diagnosis not present

## 2022-07-15 DIAGNOSIS — R188 Other ascites: Secondary | ICD-10-CM

## 2022-07-15 DIAGNOSIS — C3491 Malignant neoplasm of unspecified part of right bronchus or lung: Secondary | ICD-10-CM | POA: Diagnosis present

## 2022-07-15 DIAGNOSIS — F17200 Nicotine dependence, unspecified, uncomplicated: Secondary | ICD-10-CM | POA: Diagnosis not present

## 2022-07-15 DIAGNOSIS — D84821 Immunodeficiency due to drugs: Secondary | ICD-10-CM | POA: Diagnosis present

## 2022-07-15 DIAGNOSIS — Z800 Family history of malignant neoplasm of digestive organs: Secondary | ICD-10-CM

## 2022-07-15 DIAGNOSIS — I10 Essential (primary) hypertension: Secondary | ICD-10-CM | POA: Diagnosis present

## 2022-07-15 DIAGNOSIS — F17210 Nicotine dependence, cigarettes, uncomplicated: Secondary | ICD-10-CM | POA: Diagnosis present

## 2022-07-15 DIAGNOSIS — Z887 Allergy status to serum and vaccine status: Secondary | ICD-10-CM

## 2022-07-15 DIAGNOSIS — K652 Spontaneous bacterial peritonitis: Secondary | ICD-10-CM | POA: Diagnosis present

## 2022-07-15 DIAGNOSIS — D62 Acute posthemorrhagic anemia: Secondary | ICD-10-CM | POA: Diagnosis present

## 2022-07-15 DIAGNOSIS — D61818 Other pancytopenia: Secondary | ICD-10-CM

## 2022-07-15 DIAGNOSIS — E43 Unspecified severe protein-calorie malnutrition: Secondary | ICD-10-CM | POA: Diagnosis present

## 2022-07-15 DIAGNOSIS — Z823 Family history of stroke: Secondary | ICD-10-CM

## 2022-07-15 DIAGNOSIS — N179 Acute kidney failure, unspecified: Secondary | ICD-10-CM

## 2022-07-15 DIAGNOSIS — R1310 Dysphagia, unspecified: Secondary | ICD-10-CM | POA: Diagnosis present

## 2022-07-15 DIAGNOSIS — Z515 Encounter for palliative care: Secondary | ICD-10-CM | POA: Diagnosis not present

## 2022-07-15 DIAGNOSIS — Z1152 Encounter for screening for COVID-19: Secondary | ICD-10-CM | POA: Diagnosis not present

## 2022-07-15 DIAGNOSIS — J9601 Acute respiratory failure with hypoxia: Secondary | ICD-10-CM | POA: Diagnosis not present

## 2022-07-15 DIAGNOSIS — C786 Secondary malignant neoplasm of retroperitoneum and peritoneum: Secondary | ICD-10-CM | POA: Diagnosis present

## 2022-07-15 DIAGNOSIS — J690 Pneumonitis due to inhalation of food and vomit: Secondary | ICD-10-CM

## 2022-07-15 DIAGNOSIS — J470 Bronchiectasis with acute lower respiratory infection: Secondary | ICD-10-CM | POA: Diagnosis present

## 2022-07-15 DIAGNOSIS — Z66 Do not resuscitate: Secondary | ICD-10-CM | POA: Diagnosis not present

## 2022-07-15 DIAGNOSIS — E860 Dehydration: Secondary | ICD-10-CM | POA: Diagnosis present

## 2022-07-15 DIAGNOSIS — G893 Neoplasm related pain (acute) (chronic): Secondary | ICD-10-CM | POA: Diagnosis present

## 2022-07-15 DIAGNOSIS — L8990 Pressure ulcer of unspecified site, unspecified stage: Secondary | ICD-10-CM | POA: Insufficient documentation

## 2022-07-15 DIAGNOSIS — T451X5A Adverse effect of antineoplastic and immunosuppressive drugs, initial encounter: Secondary | ICD-10-CM | POA: Diagnosis present

## 2022-07-15 DIAGNOSIS — Z634 Disappearance and death of family member: Secondary | ICD-10-CM

## 2022-07-15 DIAGNOSIS — L89152 Pressure ulcer of sacral region, stage 2: Secondary | ICD-10-CM | POA: Diagnosis not present

## 2022-07-15 DIAGNOSIS — Z7189 Other specified counseling: Secondary | ICD-10-CM | POA: Diagnosis not present

## 2022-07-15 DIAGNOSIS — Z79899 Other long term (current) drug therapy: Secondary | ICD-10-CM

## 2022-07-15 DIAGNOSIS — R54 Age-related physical debility: Secondary | ICD-10-CM | POA: Diagnosis present

## 2022-07-15 DIAGNOSIS — Z888 Allergy status to other drugs, medicaments and biological substances status: Secondary | ICD-10-CM

## 2022-07-15 DIAGNOSIS — Z7951 Long term (current) use of inhaled steroids: Secondary | ICD-10-CM

## 2022-07-15 LAB — CBC WITH DIFFERENTIAL/PLATELET
Abs Immature Granulocytes: 0.06 10*3/uL (ref 0.00–0.07)
Basophils Absolute: 0 10*3/uL (ref 0.0–0.1)
Basophils Relative: 0 %
Eosinophils Absolute: 0 10*3/uL (ref 0.0–0.5)
Eosinophils Relative: 0 %
HCT: 30.2 % — ABNORMAL LOW (ref 39.0–52.0)
Hemoglobin: 9.6 g/dL — ABNORMAL LOW (ref 13.0–17.0)
Immature Granulocytes: 2 %
Lymphocytes Relative: 22 %
Lymphs Abs: 0.7 10*3/uL (ref 0.7–4.0)
MCH: 24.1 pg — ABNORMAL LOW (ref 26.0–34.0)
MCHC: 31.8 g/dL (ref 30.0–36.0)
MCV: 75.9 fL — ABNORMAL LOW (ref 80.0–100.0)
Monocytes Absolute: 0.6 10*3/uL (ref 0.1–1.0)
Monocytes Relative: 17 %
Neutro Abs: 2 10*3/uL (ref 1.7–7.7)
Neutrophils Relative %: 59 %
Platelets: 111 10*3/uL — ABNORMAL LOW (ref 150–400)
RBC: 3.98 MIL/uL — ABNORMAL LOW (ref 4.22–5.81)
RDW: 15.1 % (ref 11.5–15.5)
WBC: 3.4 10*3/uL — ABNORMAL LOW (ref 4.0–10.5)
nRBC: 0.6 % — ABNORMAL HIGH (ref 0.0–0.2)

## 2022-07-15 LAB — URINALYSIS, ROUTINE W REFLEX MICROSCOPIC
Bilirubin Urine: NEGATIVE
Glucose, UA: NEGATIVE mg/dL
Hgb urine dipstick: NEGATIVE
Ketones, ur: NEGATIVE mg/dL
Leukocytes,Ua: NEGATIVE
Nitrite: NEGATIVE
Protein, ur: NEGATIVE mg/dL
Specific Gravity, Urine: 1.023 (ref 1.005–1.030)
pH: 5 (ref 5.0–8.0)

## 2022-07-15 LAB — BASIC METABOLIC PANEL
Anion gap: 43 — ABNORMAL HIGH (ref 5–15)
BUN: 43 mg/dL — ABNORMAL HIGH (ref 8–23)
CO2: 27 mmol/L (ref 22–32)
Calcium: 6.8 mg/dL — ABNORMAL LOW (ref 8.9–10.3)
Chloride: 84 mmol/L — ABNORMAL LOW (ref 98–111)
Creatinine, Ser: 0.38 mg/dL — ABNORMAL LOW (ref 0.61–1.24)
GFR, Estimated: >60 mL/min (ref >60)
Glucose, Bld: 132 mg/dL — ABNORMAL HIGH (ref 70–99)
Potassium: 5.6 mmol/L — ABNORMAL HIGH (ref 3.5–5.1)
Sodium: 154 mmol/L — ABNORMAL HIGH (ref 135–145)

## 2022-07-15 LAB — ALBUMIN, PLEURAL OR PERITONEAL FLUID: Albumin, Fluid: 2.8 g/dL

## 2022-07-15 LAB — RENAL FUNCTION PANEL
Albumin: 2.5 g/dL — ABNORMAL LOW (ref 3.5–5.0)
Anion gap: 10 (ref 5–15)
BUN: 46 mg/dL — ABNORMAL HIGH (ref 8–23)
CO2: 34 mmol/L — ABNORMAL HIGH (ref 22–32)
Calcium: 8.3 mg/dL — ABNORMAL LOW (ref 8.9–10.3)
Chloride: 94 mmol/L — ABNORMAL LOW (ref 98–111)
Creatinine, Ser: 1.61 mg/dL — ABNORMAL HIGH (ref 0.61–1.24)
GFR, Estimated: 43 mL/min — ABNORMAL LOW (ref >60)
Glucose, Bld: 163 mg/dL — ABNORMAL HIGH (ref 70–99)
Phosphorus: 4.9 mg/dL — ABNORMAL HIGH (ref 2.5–4.6)
Potassium: 4 mmol/L (ref 3.5–5.1)
Sodium: 138 mmol/L (ref 135–145)

## 2022-07-15 LAB — GRAM STAIN

## 2022-07-15 LAB — BODY FLUID CELL COUNT WITH DIFFERENTIAL
Lymphs, Fluid: 57 %
Monocyte-Macrophage-Serous Fluid: 7 % — ABNORMAL LOW (ref 50–90)
Neutrophil Count, Fluid: 36 % — ABNORMAL HIGH (ref 0–25)
Total Nucleated Cell Count, Fluid: UNDETERMINED cu mm (ref 0–1000)

## 2022-07-15 LAB — LACTIC ACID, PLASMA
Lactic Acid, Venous: 1.5 mmol/L (ref 0.5–1.9)
Lactic Acid, Venous: 2 mmol/L (ref 0.5–1.9)

## 2022-07-15 LAB — RESP PANEL BY RT-PCR (FLU A&B, COVID) ARPGX2
Influenza A by PCR: NEGATIVE
Influenza B by PCR: NEGATIVE
SARS Coronavirus 2 by RT PCR: NEGATIVE

## 2022-07-15 LAB — HEMOGLOBIN AND HEMATOCRIT, BLOOD
HCT: 28.9 % — ABNORMAL LOW (ref 39.0–52.0)
Hemoglobin: 9 g/dL — ABNORMAL LOW (ref 13.0–17.0)

## 2022-07-15 LAB — PROCALCITONIN: Procalcitonin: 0.31 ng/mL

## 2022-07-15 LAB — BRAIN NATRIURETIC PEPTIDE: B Natriuretic Peptide: 41.2 pg/mL (ref 0.0–100.0)

## 2022-07-15 LAB — MRSA NEXT GEN BY PCR, NASAL: MRSA by PCR Next Gen: NOT DETECTED

## 2022-07-15 MED ORDER — VANCOMYCIN HCL 1250 MG/250ML IV SOLN
1250.0000 mg | INTRAVENOUS | Status: DC
  Administered 2022-07-15: 1250 mg via INTRAVENOUS
  Filled 2022-07-15: qty 250

## 2022-07-15 MED ORDER — VANCOMYCIN HCL 1250 MG/250ML IV SOLN
1250.0000 mg | Freq: Once | INTRAVENOUS | Status: DC
  Filled 2022-07-15: qty 250

## 2022-07-15 MED ORDER — OXYCODONE HCL 5 MG PO TABS
5.0000 mg | ORAL_TABLET | Freq: Four times a day (QID) | ORAL | Status: DC | PRN
  Administered 2022-07-17 – 2022-07-21 (×4): 5 mg via ORAL
  Filled 2022-07-15 (×4): qty 1

## 2022-07-15 MED ORDER — METHYLPREDNISOLONE SODIUM SUCC 125 MG IJ SOLR
125.0000 mg | Freq: Once | INTRAMUSCULAR | Status: AC
  Administered 2022-07-15: 125 mg via INTRAVENOUS
  Filled 2022-07-15: qty 2

## 2022-07-15 MED ORDER — VANCOMYCIN HCL 1250 MG/250ML IV SOLN
1250.0000 mg | INTRAVENOUS | Status: DC
  Filled 2022-07-15: qty 250

## 2022-07-15 MED ORDER — ONDANSETRON HCL 4 MG PO TABS: 4.0000 mg | ORAL_TABLET | Freq: Four times a day (QID) | ORAL | Status: DC | PRN

## 2022-07-15 MED ORDER — BISACODYL 10 MG RE SUPP: 10.0000 mg | Freq: Every day | RECTAL | Status: DC | PRN

## 2022-07-15 MED ORDER — ALBUTEROL SULFATE (2.5 MG/3ML) 0.083% IN NEBU
5.0000 mg | INHALATION_SOLUTION | Freq: Once | RESPIRATORY_TRACT | Status: AC
  Administered 2022-07-15: 5 mg via RESPIRATORY_TRACT
  Filled 2022-07-15: qty 6

## 2022-07-15 MED ORDER — LIDOCAINE HCL 1 % IJ SOLN
INTRAMUSCULAR | Status: AC
  Administered 2022-07-15: 15 mL
  Filled 2022-07-15: qty 20

## 2022-07-15 MED ORDER — SODIUM CHLORIDE 0.9 % IV SOLN
500.0000 mg | Freq: Once | INTRAVENOUS | Status: DC
  Filled 2022-07-15: qty 5

## 2022-07-15 MED ORDER — HYDROMORPHONE HCL 1 MG/ML IJ SOLN
0.5000 mg | INTRAMUSCULAR | Status: DC | PRN
  Administered 2022-07-15 – 2022-07-24 (×7): 0.5 mg via INTRAVENOUS
  Filled 2022-07-15 (×8): qty 0.5

## 2022-07-15 MED ORDER — ACETAMINOPHEN 650 MG RE SUPP: 650.0000 mg | Freq: Four times a day (QID) | RECTAL | Status: DC | PRN

## 2022-07-15 MED ORDER — NICOTINE 21 MG/24HR TD PT24
21.0000 mg | MEDICATED_PATCH | Freq: Every day | TRANSDERMAL | Status: DC
  Administered 2022-07-16 – 2022-07-25 (×10): 21 mg via TRANSDERMAL
  Filled 2022-07-15 (×10): qty 1

## 2022-07-15 MED ORDER — SODIUM CHLORIDE 0.9 % IV SOLN
2.0000 g | Freq: Two times a day (BID) | INTRAVENOUS | Status: DC
  Administered 2022-07-16 (×2): 2 g via INTRAVENOUS
  Filled 2022-07-15 (×2): qty 12.5

## 2022-07-15 MED ORDER — ACETAMINOPHEN 325 MG PO TABS
650.0000 mg | ORAL_TABLET | Freq: Four times a day (QID) | ORAL | Status: DC | PRN
  Administered 2022-07-19 – 2022-07-23 (×4): 650 mg via ORAL
  Filled 2022-07-15 (×4): qty 2

## 2022-07-15 MED ORDER — SODIUM CHLORIDE 0.9 % IV BOLUS
1000.0000 mL | Freq: Once | INTRAVENOUS | Status: AC
  Administered 2022-07-15: 1000 mL via INTRAVENOUS

## 2022-07-15 MED ORDER — ONDANSETRON HCL 4 MG/2ML IJ SOLN
4.0000 mg | Freq: Four times a day (QID) | INTRAMUSCULAR | Status: DC | PRN
  Administered 2022-07-16 – 2022-07-20 (×3): 4 mg via INTRAVENOUS
  Filled 2022-07-15 (×3): qty 2

## 2022-07-15 MED ORDER — METRONIDAZOLE 500 MG/100ML IV SOLN
500.0000 mg | Freq: Two times a day (BID) | INTRAVENOUS | Status: DC
  Administered 2022-07-15 – 2022-07-25 (×19): 500 mg via INTRAVENOUS
  Filled 2022-07-15 (×20): qty 100

## 2022-07-15 MED ORDER — CHLORHEXIDINE GLUCONATE CLOTH 2 % EX PADS
6.0000 | MEDICATED_PAD | Freq: Every day | CUTANEOUS | Status: DC
  Administered 2022-07-16 – 2022-07-25 (×9): 6 via TOPICAL

## 2022-07-15 MED ORDER — VANCOMYCIN HCL 1250 MG/250ML IV SOLN: 1250.0000 mg | INTRAVENOUS | Status: DC

## 2022-07-15 MED ORDER — SODIUM CHLORIDE 0.9% FLUSH
10.0000 mL | INTRAVENOUS | Status: DC | PRN
  Administered 2022-07-16: 10 mL

## 2022-07-15 MED ORDER — SODIUM CHLORIDE 0.9 % IV SOLN
2.0000 g | Freq: Three times a day (TID) | INTRAVENOUS | Status: DC
  Administered 2022-07-15: 2 g via INTRAVENOUS
  Filled 2022-07-15: qty 12.5

## 2022-07-15 MED ORDER — IOHEXOL 350 MG/ML SOLN
100.0000 mL | Freq: Once | INTRAVENOUS | Status: AC | PRN
  Administered 2022-07-15: 100 mL via INTRAVENOUS

## 2022-07-15 MED ORDER — IPRATROPIUM-ALBUTEROL 0.5-2.5 (3) MG/3ML IN SOLN
3.0000 mL | Freq: Four times a day (QID) | RESPIRATORY_TRACT | Status: DC | PRN
  Administered 2022-07-17 – 2022-07-23 (×3): 3 mL via RESPIRATORY_TRACT
  Filled 2022-07-15 (×4): qty 3

## 2022-07-15 MED ORDER — SODIUM CHLORIDE 0.9 % IV BOLUS
500.0000 mL | Freq: Once | INTRAVENOUS | Status: AC
  Administered 2022-07-15: 500 mL via INTRAVENOUS

## 2022-07-15 MED ORDER — POLYETHYLENE GLYCOL 3350 17 G PO PACK
17.0000 g | PACK | Freq: Two times a day (BID) | ORAL | Status: DC | PRN
  Administered 2022-07-18: 17 g via ORAL
  Filled 2022-07-15: qty 1

## 2022-07-15 MED ORDER — SODIUM CHLORIDE 0.9 % IV SOLN: 1.0000 g | Freq: Once | INTRAVENOUS | Status: DC

## 2022-07-15 NOTE — Progress Notes (Signed)
Patient arrived to 4E bed 6 from ED via stretcher on 9L O2 per [place]. Will attempt titrated decrease with close monitoring of SPO2 and dyspnea.

## 2022-07-15 NOTE — ED Triage Notes (Signed)
Pt BIB EMS from home, c/o shortness of breath worsen over the past 4 days. 83% EMS arrival. Pt wears O2 as needed but no O2 noted. Pt also c/o N/V x 3 days, hx of lung cancer. 400 mL LR, duoneb x1, 4 mg zofran, 125 solumedrol through EMS placed IV   BP 100/70 P 110 spO2 95% 2 liters CBG 185

## 2022-07-15 NOTE — ED Notes (Signed)
Pt has urinal at bedside

## 2022-07-15 NOTE — Progress Notes (Addendum)
Pharmacy Antibiotic Note  Jeffrey Watson is a 81 y.o. male admitted on 07/15/2022. Pharmacy has been consulted for vancomycin and cefepime dosing for sepsis.  Plan: -Vancomycin 1250 mg IV q24h -Cefepime 2 g IV q8h -Metronidazole per MD -Continue to follow renal function, cultures and clinical progress for antibiotic dose adjustment and de-escalation as indicated  ADDENDUM 10/27 2000: -SCr 1.61, increase - change vancomycin to 1250 mg IV q48h and cefepime to 2 g IV q12h     Temp (24hrs), Avg:98 F (36.7 C), Min:97.8 F (36.6 C), Max:98.1 F (36.7 C)  Recent Labs  Lab 07/15/22 1038 07/15/22 1216  WBC 3.4*  --   CREATININE  --  0.38*    Estimated Creatinine Clearance: 61.5 mL/min (A) (by C-G formula based on SCr of 0.38 mg/dL (L)).    Allergies  Allergen Reactions   Eggs Or Egg-Derived Products Nausea And Vomiting   Influenza Vaccines Nausea And Vomiting   Lisinopril Other (See Comments)    Reports chest pain with lisinopril   Midazolam Other (See Comments)    Did not tolerate for moderate sedation with port revision and chest tube placement on 09/28/21. Unable to follow commands and severely agitated.    Penicillins Diarrhea    Has patient had a PCN reaction causing immediate rash, facial/tongue/throat swelling, SOB or lightheadedness with hypotension: No Has patient had a PCN reaction causing severe rash involving mucus membranes or skin necrosis: No Has patient had a PCN reaction that required hospitalization: No Has patient had a PCN reaction occurring within the last 10 years: No If all of the above answers are "NO", then may proceed with Cephalosporin use.     Antimicrobials this admission: Cefepime 10/27 >> Vancomycin 10/27 >> Flagyl 10/27 >>  Dose adjustments this admission:  10/27 Vanc 1250 mg q24h >> 1250 mg q48h; cefepime 2 g q8h >> q12h  Microbiology results: 10/27 BCx: pending 10/27 MRSA PCR: pending  Thank you for allowing pharmacy to be a part of this  patient's care.  Tawnya Crook, PharmD, BCPS Clinical Pharmacist 07/15/2022 3:47 PM

## 2022-07-15 NOTE — ED Provider Notes (Addendum)
[place] DEPT Provider Note   CSN: [id number] Arrival date & time: 07/15/22  0932     History  Chief Complaint  Patient presents with   Shortness of Breath   Nausea   Emesis    Jeffrey Watson is a 81 y.o. male.  Pt is a 81 yo male with a pmhx significant for htn, copd, and stage IV adenocarcinoma.  Pt said he has prn oxygen at home, but does not usually have to use it.  When he does, it is only 2L.  He has been sob for the past few days.  He's also had some n/v.  Pt's O2 sat was in the low 80s upon EMS arrival.  He was put on 4L and given a duoneb and zofran.  Sx have improved, but he still feels very sob.  He still smokes.  His cancer car is managed at [facility] He last received Pembrolizumab and Pemetrexed on 10/19.  Pt denies f/c.        Home Medications Prior to Admission medications   Medication Sig Start Date End Date Taking? Authorizing Provider  albuterol (ACCUNEB) 1.25 MG/3ML nebulizer solution Take by nebulization. 05/10/22   [provider]  amLODipine (NORVASC) 10 MG tablet Take 10 mg by mouth daily. 04/21/22   [provider]  amLODipine (NORVASC) 5 MG tablet TAKE 1 TABLET BY MOUTH ONCE DAILY 01/20/17   Breeback, Jade L, PA-C  EQ ASPIRIN ADULT LOW DOSE 81 MG EC tablet Take 81 mg by mouth daily. 02/08/18   [provider]  Fluticasone Furoate-Vilanterol (BREO ELLIPTA) 100-25 MCG/INH AEPB One inhalation by mouth daily, wash mouth out after use. Patient not taking: Reported on 03/01/2018 07/06/15   Marcial Pacas, DO  folic acid (FOLVITE) 1 MG tablet Take 1 mg by mouth daily. 07/01/22   [provider]  HYDROcodone-acetaminophen (NORCO/VICODIN) 5-325 MG per tablet Take 1 tablet by mouth every 6 (six) hours as needed for moderate pain. Patient not taking: Reported on 03/01/2018 03/30/15   Marcial Pacas, DO  ipratropium-albuterol (DUONEB) 0.5-2.5 (3) MG/3ML SOLN Take 3 mLs by nebulization every 6 (six) hours as  needed.    [provider]  LORazepam (ATIVAN) 1 MG tablet Take 1 mg by mouth daily as needed. 03/29/22   [provider]  lubiprostone (AMITIZA) 24 MCG capsule Take 1 capsule (24 mcg total) by mouth 2 (two) times daily with a meal. Patient not taking: Reported on 03/01/2018 01/22/15   Marcial Pacas, DO  metoprolol succinate (TOPROL-XL) 50 MG 24 hr tablet Take 50 mg by mouth daily. 02/08/18   [provider]  nitroGLYCERIN (NITROSTAT) 0.4 MG SL tablet Place 0.4 mg under the tongue every 5 (five) minutes as needed for chest pain.  02/08/18   [provider]  oxyCODONE (ROXICODONE) 5 MG immediate release tablet Take 1 tablet (5 mg total) by mouth every 6 (six) hours as needed for up to 12 doses for severe pain. 07/04/22   Fransico Meadow, MD  ranitidine (ZANTAC) 150 MG tablet TAKE ONE TABLET BY MOUTH  TWICE DAILY . Patient not taking: Reported on 03/01/2018 12/05/16   Donella Stade, PA-C  tamsulosin (FLOMAX) 0.4 MG CAPS capsule Take 1 capsule (0.4 mg total) by mouth daily. Needs a follow up with new PCP. 11/16/16   Iran Planas L, PA-C  valsartan-hydrochlorothiazide (DIOVAN HCT) 160-12.5 MG tablet Take 1 tablet by mouth daily. Replaces lisinopril Patient not taking: Reported on 03/01/2018 05/18/16   Marcial Pacas,  DO      Allergies    Eggs or egg-derived products, Influenza vaccines, Lisinopril, Midazolam, and Penicillins    Review of Systems   Review of Systems  Respiratory:  Positive for shortness of breath.   All other systems reviewed and are negative.   Physical Exam Updated Vital Signs BP 111/71   Pulse (!) 104   Temp 98.1 F (36.7 C)   Resp (!) 30   SpO2 96%  Physical Exam Vitals and nursing note reviewed.  Constitutional:      Appearance: He is well-developed and underweight. He is ill-appearing.  HENT:     Head: Normocephalic and atraumatic.     Mouth/Throat:     Mouth: Mucous membranes are moist.     Pharynx: Oropharynx is clear.  Eyes:      Extraocular Movements: Extraocular movements intact.     Pupils: Pupils are equal, round, and reactive to light.  Cardiovascular:     Rate and Rhythm: Normal rate and regular rhythm.  Pulmonary:     Effort: Tachypnea present.     Breath sounds: Wheezing present.  Abdominal:     General: Bowel sounds are normal.     Palpations: Abdomen is soft.     Tenderness: There is generalized abdominal tenderness.     Comments: Mild ascites  Musculoskeletal:        General: Normal range of motion.     Cervical back: Normal range of motion and neck supple.  Skin:    General: Skin is warm.     Capillary Refill: Capillary refill takes less than 2 seconds.  Neurological:     General: No focal deficit present.     Mental Status: He is alert and oriented to person, place, and time.  Psychiatric:        Mood and Affect: Mood normal.        Behavior: Behavior normal.     ED Results / Procedures / Treatments   Labs (all labs ordered are listed, but only abnormal results are displayed) Labs Reviewed  CBC WITH DIFFERENTIAL/PLATELET - Abnormal; Notable for the following components:      Result Value   WBC 3.4 (*)    RBC 3.98 (*)    Hemoglobin 9.6 (*)    HCT 30.2 (*)    MCV 75.9 (*)    MCH 24.1 (*)    Platelets 111 (*)    nRBC 0.6 (*)    All other components within normal limits  URINALYSIS, ROUTINE W REFLEX MICROSCOPIC - Abnormal; Notable for the following components:   APPearance HAZY (*)    All other components within normal limits  BASIC METABOLIC PANEL - Abnormal; Notable for the following components:   Sodium 154 (*)    Potassium 5.6 (*)    Chloride 84 (*)    Glucose, Bld 132 (*)    BUN 43 (*)    Creatinine, Ser 0.38 (*)    Calcium 6.8 (*)    Anion gap 43 (*)    All other components within normal limits  RESP PANEL BY RT-PCR (FLU A&B, COVID) ARPGX2  CULTURE, BLOOD (ROUTINE X 2)  CULTURE, BLOOD (ROUTINE X 2)  CULTURE, RESPIRATORY W GRAM STAIN  MRSA NEXT GEN BY PCR, NASAL   BRAIN NATRIURETIC PEPTIDE  BASIC METABOLIC PANEL  LACTIC ACID, PLASMA  LACTIC ACID, PLASMA    EKG EKG Interpretation  Date/Time:  Friday July 15 2022 10:00:54 EDT Ventricular Rate:  113 PR Interval:  136 QRS Duration: 93  QT Interval:  323 QTC Calculation: 443 R Axis:   74 Text Interpretation: Sinus tachycardia Anteroseptal infarct, old No old tracing to compare Confirmed by Isla Pence [phone]) on 07/15/2022 10:21:03 AM  Radiology CT ABDOMEN PELVIS W CONTRAST  Result Date: 07/15/2022 CLINICAL DATA:  Abdominal pain worsening over the past 4 days. EXAM: CT ABDOMEN AND PELVIS WITH CONTRAST TECHNIQUE: Multidetector CT imaging of the abdomen and pelvis was performed using the standard protocol following bolus administration of intravenous contrast. RADIATION DOSE REDUCTION: This exam was performed according to the departmental dose-optimization program which includes automated exposure control, adjustment of the mA and/or kV according to patient size and/or use of iterative reconstruction technique. CONTRAST:  148mL OMNIPAQUE IOHEXOL 350 MG/ML SOLN COMPARISON:  07/04/2022 FINDINGS: Lower chest: Small right pleural effusion. Right lower lobe airspace disease likely reflecting atelectasis. Left basilar scratch them mild left basilar atelectasis. Hepatobiliary: No focal liver abnormality is seen. No gallstones, gallbladder wall thickening, or biliary dilatation. Pancreas: Unremarkable. No pancreatic ductal dilatation or surrounding inflammatory changes. Spleen: Normal in size without focal abnormality. Adrenals/Urinary Tract: Adrenal glands are unremarkable. Kidneys are normal, without renal calculi, focal lesion, or hydronephrosis. Bladder is unremarkable. Stomach/Bowel: Stomach is within normal limits. No evidence of bowel wall thickening, distention, or inflammatory changes. Vascular/Lymphatic: Normal caliber abdominal aorta with mild atherosclerosis. No lymphadenopathy. Reproductive:  Prostate is unremarkable. Other: Large volume ascites with a small amount of hyperdense material in the pelvis concerning for hemorrhage. Small fluid containing left inguinal hernia. Omental nodularity concerning for peritoneal spread of malignancy. Musculoskeletal: No acute osseous abnormality. No aggressive osseous lesion. Degenerative disease with disc height loss at L1-2, L2-3, L3-4, L4-5 and L5-S1. Bilateral facet arthropathy throughout the lumbar spine. IMPRESSION: 1. Large volume ascites with a small amount of hyperdense material in the pelvis concerning for hemorrhage. Omental nodularity concerning for peritoneal spread of malignancy. 2. Small right pleural effusion with right lower lobe airspace disease likely reflecting atelectasis. 3.  Aortic Atherosclerosis (ICD10-I70.0). Electronically Signed   By: Kathreen Devoid M.D.   On: 07/15/2022 14:42   CT Angio Chest PE W and/or Wo Contrast  Result Date: 07/15/2022 CLINICAL DATA:  Shortness of breath. EXAM: CT ANGIOGRAPHY CHEST WITH CONTRAST TECHNIQUE: Multidetector CT imaging of the chest was performed using the standard protocol during bolus administration of intravenous contrast. Multiplanar CT image reconstructions and MIPs were obtained to evaluate the vascular anatomy. RADIATION DOSE REDUCTION: This exam was performed according to the departmental dose-optimization program which includes automated exposure control, adjustment of the mA and/or kV according to patient size and/or use of iterative reconstruction technique. CONTRAST:  180mL OMNIPAQUE IOHEXOL 350 MG/ML SOLN COMPARISON:  June 20, 2022. FINDINGS: Cardiovascular: Satisfactory opacification of the pulmonary arteries to the segmental level. No evidence of pulmonary embolism. Normal heart size. No pericardial effusion. Mediastinum/Nodes: Thyroid gland is unremarkable. No definite adenopathy is noted. Moderate to severe diffuse esophageal wall thickening is noted concerning for esophagitis.  Lungs/Pleura: No pneumothorax is noted. Small right pleural effusion is noted with associated right lower lobe atelectasis or infiltrate. Bronchial wall thickening and probable mucoid impaction is noted in the right lower lobe. Severe emphysematous disease is noted. Mild left posterior basilar subsegmental atelectasis is noted. Musculoskeletal: No chest wall abnormality. No acute or significant osseous findings. Review of the MIP images confirms the above findings. IMPRESSION: No definite evidence of pulmonary embolus. Moderate to severe diffuse esophageal wall thickening is noted concerning for esophagitis. Endoscopy is recommended for further evaluation. Small right pleural effusion is noted  with associated right lower lobe atelectasis or infiltrate. There is associated bronchial wall thickening and probable mucoid impaction also noted in the right lower lobe. Severe emphysematous disease is noted bilaterally. Aortic Atherosclerosis (ICD10-I70.0) and Emphysema (ICD10-J43.9). Electronically Signed   By: Marijo Conception M.D.   On: 07/15/2022 14:40   DG Chest Port 1 View  Result Date: 07/15/2022 CLINICAL DATA:  Shortness of breath.  Hypertension, smoking EXAM: PORTABLE CHEST 1 VIEW COMPARISON:  06/22/2022 FINDINGS: Bilateral emphysematous changes. Right lung volume loss. Right upper lobe scarring. Small right pleural effusion. No left pleural effusion. No pneumothorax. Stable cardiomediastinal silhouette. Right-sided Port-A-Cath in satisfactory position. No acute osseous abnormality. IMPRESSION: 1. No acute cardiopulmonary disease. 2. Right lung volume loss. Right upper lobe scarring. Electronically Signed   By: Kathreen Devoid M.D.   On: 07/15/2022 10:19    Procedures Procedures    Medications Ordered in ED Medications  cefTRIAXone (ROCEPHIN) 1 g in sodium chloride 0.9 % 100 mL IVPB (has no administration in time range)  azithromycin (ZITHROMAX) 500 mg in sodium chloride 0.9 % 250 mL IVPB (has no  administration in time range)  methylPREDNISolone sodium succinate (SOLU-MEDROL) 125 mg/2 mL injection 125 mg (125 mg Intravenous Given 07/15/22 1019)  albuterol (PROVENTIL) (2.5 MG/3ML) 0.083% nebulizer solution 5 mg (5 mg Nebulization Given 07/15/22 1019)  sodium chloride 0.9 % bolus 1,000 mL (1,000 mLs Intravenous New Bag/Given 07/15/22 1446)  iohexol (OMNIPAQUE) 350 MG/ML injection 100 mL (100 mLs Intravenous Contrast Given 07/15/22 1356)    ED Course/ Medical Decision Making/ A&P                           Medical Decision Making Amount and/or Complexity of Data Reviewed Labs: ordered. Radiology: ordered.  Risk Prescription drug management. Decision regarding hospitalization.   This patient presents to the ED for concern of sob, this involves an extensive number of treatment options, and is a complaint that carries with it a high risk of complications and morbidity.  The differential diagnosis includes copd exac, pna, uri, covid/flu, PE   Co morbidities that complicate the patient evaluation  htn, copd, and stage IV adenocarcinoma   Additional history obtained:  Additional history obtained from epic chart review External records from outside source obtained and reviewed including EMS report   Lab Tests:  I Ordered, and personally interpreted labs.  The pertinent results include:  cbc with wbc low at 3.4, hgb low at 9.6 (CBC with wbc 6.8, hgb 10.8, and plt 351 on 10/19), plt low at 111; covid/flu neg; bnp 41.2; bmp with na elevated at 154, K elevated at 5.6; bun elevated at 43   Imaging Studies ordered:  I ordered imaging studies including cxr, ct chest/abd/pelvis I independently visualized and interpreted imaging which showed  CXR: IMPRESSION:  1. No acute cardiopulmonary disease.  2. Right lung volume loss. Right upper lobe scarring.  CT chest: IMPRESSION:  No definite evidence of pulmonary embolus.    Moderate to severe diffuse esophageal wall thickening is  noted  concerning for esophagitis. Endoscopy is recommended for further  evaluation.    Small right pleural effusion is noted with associated right lower  lobe atelectasis or infiltrate. There is associated bronchial wall  thickening and probable mucoid impaction also noted in the right  lower lobe.    Severe emphysematous disease is noted bilaterally.    Aortic Atherosclerosis (ICD10-I70.0) and Emphysema (ICD10-J43.9).  CT abd/pelvis: IMPRESSION:  1. Large volume ascites  with a small amount of hyperdense material  in the pelvis concerning for hemorrhage. Omental nodularity  concerning for peritoneal spread of malignancy.  2. Small right pleural effusion with right lower lobe airspace  disease likely reflecting atelectasis.  3.  Aortic Atherosclerosis (ICD10-I70.0).   I agree with the radiologist interpretation   Cardiac Monitoring:  The patient was maintained on a cardiac monitor.  I personally viewed and interpreted the cardiac monitored which showed an underlying rhythm of: st   Medicines ordered and prescription drug management:  I ordered medication including solumedrol/albuterol  for copd exac  Reevaluation of the patient after these medicines showed that the patient improved I have reviewed the patients home medicines and have made adjustments as needed   Test Considered:  ct   Critical Interventions:  oxygen   Consultations Obtained:  I requested consultation with surgery,  and discussed lab and imaging findings as well as pertinent plan - they looked at the abd/pelvis scan and do not feel there is anything surgical to do.  Monitor h/h. Pt d/w Dr. Cyndia Skeeters ([facility]) for admission.   Problem List / ED Course:  SOB:  O2 sat drops to the low 80s on RA.  CT chest with severe COPD and pna.  Pt started on abx.  He is in the mid-90s on 4L oxygen.   Pancytopenia:  likely from his chemo given on 10/19 Hypernatremia:  likely due to dehydration Sm hemorrhage on CT:   new from prior CT done on 10/16   Reevaluation:  After the interventions noted above, I reevaluated the patient and found that they have :improved   Social Determinants of Health:  Lives at home   Dispostion:  After consideration of the diagnostic results and the patients response to treatment, I feel that the patent would benefit from admission.    CRITICAL CARE Performed by: Isla Pence   Total critical care time: 30 minutes  Critical care time was exclusive of separately billable procedures and treating other patients.  Critical care was necessary to treat or prevent imminent or life-threatening deterioration.  Critical care was time spent personally by me on the following activities: development of treatment plan with patient and/or surrogate as well as nursing, discussions with consultants, evaluation of patient's response to treatment, examination of patient, obtaining history from patient or surrogate, ordering and performing treatments and interventions, ordering and review of laboratory studies, ordering and review of radiographic studies, pulse oximetry and re-evaluation of patient's condition.         Final Clinical Impression(s) / ED Diagnoses Final diagnoses:  Acute respiratory failure with hypoxia ([place])  Community acquired pneumonia of right lower lobe of lung  COPD exacerbation ([place])  Adenocarcinoma of right lung ([place])  Malignant ascites  Hypernatremia  Dehydration  Failure to thrive in adult  Pancytopenia [facility])    Rx / DC Orders ED Discharge Orders     None         Isla Pence, MD 07/15/22 1517    Isla Pence, MD 07/15/22 1517

## 2022-07-15 NOTE — H&P (Signed)
History and Physical    Patient: Jeffrey Watson NIO:[id number] DOB: Sep 24, 1940 DOA: 07/15/2022 DOS: the patient was seen and examined on 07/15/2022 PCP: System, Provider Not In  Patient coming from: Home.  Lives with family.  Independently ambulates at baseline.  Chief Complaint:  Abdominal pain  HPI: Jeffrey Watson is a 81 y.o. 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  Patient is awake and oriented x4 except date but not a great historian.  He reports abdominal pain for months.  He states he came to ED because he is tired of it.  Pain is mainly on the right.  He rates his pain as severe.  No provoking or elevating factor.  He denies trauma.  He had right inguinal hernia repair earlier last month.  He thinks his last bowel movement was yesterday but not sure.  His bowel habit fluctuates between constipation and diarrhea.  Reports passing gas.  He denies blood in the stool or dark tarry looking stool.  He reports intermittent nausea and vomiting for the same period (months).  He was unable to quantify the emesis.  He reports some dysphagia with both liquid and solid. He denies shortness of breath he reported shortness of breath to EMS and EDP.  He admits to chronic productive cough with yellowish phlegm.  He denies hemoptysis.  He denies chest pain or UTI symptoms.  Patient was seen in ED on 10/16.  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  Patient was discharged from ED to  follow-up with his oncologist.  Patient had his palliative chemotherapy on 10/19.  Per EDP, patient presents with shortness of breath, nausea, vomiting and hypoxia to low 80s on room air for which she was put on 4 L by EMS brought to ED.  Patient lives with his wife.  Reports ambulating independently.  Reports smoking about half a pack a day.  Denies drinking alcohol recreational drug use.  Likes to remain full code until he discusses with his family.   In ED, tachycardic to 110s.  Tachypnea to 30s. Na  156.  5.6 (Hemolyzed). Cr 0.38.  BUN 43.  Bicarb 27.  AG 43.  WBC 3.4.  Hgb 9.6 from 10.8 on 10/16.  Platelet 111 (baseline normal).  UA without significant finding.  CT chest, abdomen and pelvis negative for PE but showed distal esophageal wall thickening concerning for esophagitis, emphysema, RLL infiltrate/atelectasis, large ascites, omental nodularity concerning for metastasis and possible small pelvic hemorrhage.  EKG features sinus tachycardia.  Lactic acid ordered and pending.  Case discussed with general surgery, who recommended observation.  Received a liter of normal saline bolus.  Azithromycin and ceftriaxone ordered.  [facility] service called for admission.   Review of Systems: As mentioned in the history of present illness. All other systems reviewed and are negative. Past Medical History:  Diagnosis Date   Cancer ([place])    Hypertension    Pneumonia    Past Surgical History:  Procedure Laterality Date   HERNIA REPAIR     LEFT HEART CATH AND CORONARY ANGIOGRAPHY N/A  03/05/2018   Procedure: LEFT HEART CATH AND CORONARY ANGIOGRAPHY;  Surgeon: Lorretta Harp, MD;  Location: [place] [facility];  Service: Cardiovascular;  Laterality: N/A;   TONSILLECTOMY  09/20/1979   Social History:  reports that he has been smoking cigarettes. He has a 30.00 pack-year smoking history. He has never used smokeless tobacco. He reports that he does not currently use alcohol after a past usage of about  5.0 standard drinks of alcohol per week. He reports that he does not use drugs.  Allergies  Allergen Reactions   Eggs Or Egg-Derived Products Nausea And Vomiting   Influenza Vaccines Nausea And Vomiting   Lisinopril Other (See Comments)    Reports chest pain with lisinopril   Midazolam Other (See Comments)    Did not tolerate for moderate sedation with port revision and chest tube placement on 09/28/21. Unable to follow commands and severely agitated.    Penicillins Diarrhea    Has patient had a PCN reaction causing immediate rash, facial/tongue/throat swelling, SOB or lightheadedness with hypotension: No Has patient had a PCN reaction causing severe rash involving mucus membranes or skin necrosis: No Has patient had a PCN reaction that required hospitalization: No Has patient had a PCN reaction occurring within the last 10 years: No If all of the above answers are "NO", then may proceed with Cephalosporin use.     Family History  Problem Relation Age of Onset   Heart attack Mother    Stomach cancer Other        Grandma   Stroke Other        Grandma    Prior to Admission medications   Medication Sig Start Date End Date Taking? Authorizing Provider  albuterol (ACCUNEB) 1.25 MG/3ML nebulizer solution Take by nebulization. 05/10/22   [provider]  amLODipine (NORVASC) 10 MG tablet Take 10 mg by mouth daily. 04/21/22   [provider]  amLODipine (NORVASC) 5 MG tablet TAKE 1 TABLET BY MOUTH ONCE DAILY 01/20/17   Breeback, Jade L, PA-C  EQ ASPIRIN ADULT LOW DOSE 81 MG EC tablet Take 81 mg by mouth daily. 02/08/18   [provider]  Fluticasone Furoate-Vilanterol (BREO ELLIPTA) 100-25 MCG/INH AEPB One inhalation by mouth daily, wash mouth out after use. Patient not taking: Reported on 03/01/2018 07/06/15   Marcial Pacas, DO  folic acid (FOLVITE) 1 MG tablet Take 1 mg by mouth daily. 07/01/22   [provider]  HYDROcodone-acetaminophen (NORCO/VICODIN)  5-325 MG per tablet Take 1 tablet by mouth every 6 (six) hours as needed for moderate pain. Patient not taking: Reported on 03/01/2018 03/30/15   Marcial Pacas, DO  ipratropium-albuterol (DUONEB) 0.5-2.5 (3) MG/3ML SOLN Take 3 mLs by nebulization every 6 (six) hours as needed.    [provider]  LORazepam (ATIVAN) 1 MG tablet Take 1 mg by mouth daily as needed. 03/29/22   [provider]  lubiprostone (AMITIZA) 24 MCG capsule Take 1 capsule (24 mcg total) by mouth 2 (two) times daily with a meal. Patient not taking: Reported on 03/01/2018 01/22/15   Marcial Pacas, DO  metoprolol succinate (TOPROL-XL) 50 MG 24 hr tablet Take 50 mg by mouth daily. 02/08/18   [provider]  nitroGLYCERIN (NITROSTAT) 0.4 MG SL tablet Place 0.4 mg under the tongue every 5 (five) minutes as needed for chest pain.  02/08/18   [provider]  oxyCODONE (ROXICODONE) 5 MG immediate release tablet Take 1 tablet (5 mg total) by mouth every 6 (six) hours  as needed for up to 12 doses for severe pain. 07/04/22   Fransico Meadow, MD  ranitidine (ZANTAC) 150 MG tablet TAKE ONE TABLET BY MOUTH  TWICE DAILY . Patient not taking: Reported on 03/01/2018 12/05/16   Donella Stade, PA-C  tamsulosin (FLOMAX) 0.4 MG CAPS capsule Take 1 capsule (0.4 mg total) by mouth daily. Needs a follow up with new PCP. 11/16/16   Iran Planas L, PA-C  valsartan-hydrochlorothiazide (DIOVAN HCT) 160-12.5 MG tablet Take 1 tablet by mouth daily. Replaces lisinopril Patient not taking: Reported on 03/01/2018 05/18/16   Marcial Pacas, DO    Physical Exam: Vitals:   07/15/22 1215 07/15/22 1300 07/15/22 1330 07/15/22 1500  BP:  107/71 111/71 115/67  Pulse: (!) 105 (!) 104    Resp:  (!) 30    Temp:   98.1 F (36.7 C)   TempSrc:      SpO2: 95% 96%     GENERAL: Appears frail.  No apparent distress. HEENT: MMM.  Vision and hearing grossly intact.  NECK: Supple.  No apparent JVD.  RESP: 96% on 2 L.  No IWOB.  Increased RR  to 30s.  Fair aeration bilaterally.  Very rhonchorous. CVS: Cardiac to 110s.  Regular rhythm.  Heart sounds normal.  ABD/GI/GU: BS+. Abd full and distended.  Diffuse TTP.  Small reducible left inguinal hernia MSK/EXT:  Moves extremities. No apparent deformity. No edema.  SKIN: no apparent skin lesion or wound NEURO: Awake and alert. Oriented before except date.  No apparent focal neuro deficit. PSYCH: Calm. Normal affect.  Data Reviewed: All review of system negative except for pertinent positives and negatives as history of present illness above.  Assessment and Plan: Principal Problem:   Severe sepsis with thrombocytopenia (HCC) Active Problems:   BPH (benign prostatic hyperplasia)   Essential hypertension   COPD, mild (HCC)   Acute on chronic respiratory failure with hypoxia and hypercapnia (HCC)   Adenocarcinoma of right lung (HCC)   Centrilobular emphysema (HCC)   Current every day smoker   Non-small cell lung cancer (NSCLC) (HCC)   Pancytopenia (HCC)   Ascites   Esophagitis   SBP (spontaneous bacterial peritonitis) ([place])   Failure to thrive in adult   Severe sepsis with thrombocytopenia: POA.  Has leukopenia with tachypnea and tachycardia.  Source of infection include aspiration pneumonia and possible SBP.  CXR raises concern for RLL infiltrate.  He has ongoing nausea and vomiting.  He also has ascites with tenderness to palpation raising concern for SBP.  Immunocompromised patient on chemotherapy.  He has pancytopenia including thrombocytopenia.  Last chemo on 10/19. -Broad-spectrum antibiotics with vancomycin, cefepime and Flagyl -Follow cultures and lactic acid -IR paracentesis with fluid studies for ascites. -Aspiration precaution -SLP eval. May need esophagram -PPI  Stage IV lung cancer/cancer related pain: Followed with [facility].  On palliative chemotherapy with  Pembrolizumab and Pemetrexed.  Last treatment on 10/19.  Imaging raises concern for peritoneal and  hepatic metastasis. 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  He was given some nebs prior to coming to ED.  During my evaluation, patient denied shortness of breath but chronic productive cough with whitish phlegm.   -Continue DuoNebs as needed -Schedule LABA/LAMA/ICS-not compliant with this at home. -Greatly tobacco cessation -Biotics as above -We will give steroid for now  Ascites possible SBP: Likely malignant.  He is tender  to palpation -IR paracentesis -Fluid studies sent.  Possible pelvic hemorrhage: CT showed small volume pelvic hemorrhage.  EP discussed with general surgery who recommended observation.  Patient has recent right inguinal hernia repair at [facility] on 05/27/2022. -SCD for VTE prophylaxis -Monitor H&H  Pancytopenia: Due to chemotherapy? -Continue monitoring  Dysphagia/possible esophagitis/distal esophageal wall thickening -Aspiration precaution, SLP eval and PPI  Goal of care counseling: Discussed goal of care with focus on CODE STATUS including pros and cons of CPR or intubation.  She voiced understanding but likes to discuss with family before making decision.  I asked to remain full code until then. -Palliative medicine consulted  Hypernatremia: Likely due to dehydration. -Recheck BMP  Hyperkalemia-hemolyzed blood. -Recheck  Failure to thrive/severe malnutrition/generalized weakness -PT/OT eval -Palliative medicine consult -Cell dietitian   Advance Care Planning:   Code Status: Full Code   Consults: Neurosurgery, palliative medicine  Family Communication: Attempted to call patient's wife using the phone number listed in the chart but no longer in service.  Severity of Illness: The appropriate patient status for this patient is INPATIENT. Inpatient status is judged to be reasonable and necessary in order to provide the required  intensity of service to ensure the patient's safety. The patient's presenting symptoms, physical exam findings, and initial radiographic and laboratory data in the context of their chronic comorbidities is felt to place them at high risk for further clinical deterioration. Furthermore, it is not anticipated that the patient will be medically stable for discharge from the hospital within 2 midnights of admission.   * I certify that at the point of admission it is my clinical judgment that the patient will require inpatient hospital care spanning beyond 2 midnights from the point of admission due to high intensity of service, high risk for further deterioration and high frequency of surveillance required.*  Author: Mercy Riding, MD 07/15/2022 4:49 PM  For on call review [email or web address].

## 2022-07-15 NOTE — Procedures (Addendum)
Ultrasound-guided diagnostic and therapeutic paracentesis performed yielding 2.2 liters of bloody fluid. No immediate complications. A portion of the fluid was sent to the lab for preordered studies. EBL< 2 cc. TRH notified of above findings.

## 2022-07-16 ENCOUNTER — Other Ambulatory Visit: Payer: Self-pay

## 2022-07-16 DIAGNOSIS — A419 Sepsis, unspecified organism: Secondary | ICD-10-CM | POA: Diagnosis not present

## 2022-07-16 DIAGNOSIS — Z515 Encounter for palliative care: Secondary | ICD-10-CM | POA: Diagnosis not present

## 2022-07-16 DIAGNOSIS — D61818 Other pancytopenia: Secondary | ICD-10-CM | POA: Diagnosis not present

## 2022-07-16 DIAGNOSIS — R7989 Other specified abnormal findings of blood chemistry: Secondary | ICD-10-CM

## 2022-07-16 DIAGNOSIS — N179 Acute kidney failure, unspecified: Secondary | ICD-10-CM

## 2022-07-16 DIAGNOSIS — D62 Acute posthemorrhagic anemia: Secondary | ICD-10-CM

## 2022-07-16 DIAGNOSIS — N400 Enlarged prostate without lower urinary tract symptoms: Secondary | ICD-10-CM

## 2022-07-16 DIAGNOSIS — K652 Spontaneous bacterial peritonitis: Secondary | ICD-10-CM | POA: Diagnosis not present

## 2022-07-16 DIAGNOSIS — C3491 Malignant neoplasm of unspecified part of right bronchus or lung: Secondary | ICD-10-CM | POA: Diagnosis not present

## 2022-07-16 DIAGNOSIS — R627 Adult failure to thrive: Secondary | ICD-10-CM | POA: Diagnosis not present

## 2022-07-16 LAB — COMPREHENSIVE METABOLIC PANEL
ALT: 10 U/L (ref 0–44)
AST: 19 U/L (ref 15–41)
Albumin: 2.2 g/dL — ABNORMAL LOW (ref 3.5–5.0)
Alkaline Phosphatase: 46 U/L (ref 38–126)
Anion gap: 6 (ref 5–15)
BUN: 42 mg/dL — ABNORMAL HIGH (ref 8–23)
CO2: 35 mmol/L — ABNORMAL HIGH (ref 22–32)
Calcium: 8.3 mg/dL — ABNORMAL LOW (ref 8.9–10.3)
Chloride: 100 mmol/L (ref 98–111)
Creatinine, Ser: 1.32 mg/dL — ABNORMAL HIGH (ref 0.61–1.24)
GFR, Estimated: 55 mL/min — ABNORMAL LOW (ref >60)
Glucose, Bld: 132 mg/dL — ABNORMAL HIGH (ref 70–99)
Potassium: 3.9 mmol/L (ref 3.5–5.1)
Sodium: 141 mmol/L (ref 135–145)
Total Bilirubin: 0.7 mg/dL (ref 0.3–1.2)
Total Protein: 5.8 g/dL — ABNORMAL LOW (ref 6.5–8.1)

## 2022-07-16 LAB — CBC
HCT: 24.5 % — ABNORMAL LOW (ref 39.0–52.0)
Hemoglobin: 7.6 g/dL — ABNORMAL LOW (ref 13.0–17.0)
MCH: 23.7 pg — ABNORMAL LOW (ref 26.0–34.0)
MCHC: 31 g/dL (ref 30.0–36.0)
MCV: 76.3 fL — ABNORMAL LOW (ref 80.0–100.0)
Platelets: 113 10*3/uL — ABNORMAL LOW (ref 150–400)
RBC: 3.21 MIL/uL — ABNORMAL LOW (ref 4.22–5.81)
RDW: 15.3 % (ref 11.5–15.5)
WBC: 3.7 10*3/uL — ABNORMAL LOW (ref 4.0–10.5)
nRBC: 1.6 % — ABNORMAL HIGH (ref 0.0–0.2)

## 2022-07-16 LAB — LACTIC ACID, PLASMA: Lactic Acid, Venous: 1 mmol/L (ref 0.5–1.9)

## 2022-07-16 LAB — PROCALCITONIN: Procalcitonin: 0.22 ng/mL

## 2022-07-16 LAB — HEMOGLOBIN AND HEMATOCRIT, BLOOD
HCT: 23.6 % — ABNORMAL LOW (ref 39.0–52.0)
HCT: 23.2 % — ABNORMAL LOW (ref 39.0–52.0)
HCT: 23.6 % — ABNORMAL LOW (ref 39.0–52.0)
Hemoglobin: 7.4 g/dL — ABNORMAL LOW (ref 13.0–17.0)
Hemoglobin: 7.2 g/dL — ABNORMAL LOW (ref 13.0–17.0)
Hemoglobin: 7.3 g/dL — ABNORMAL LOW (ref 13.0–17.0)

## 2022-07-16 MED ORDER — ALUM & MAG HYDROXIDE-SIMETH 200-200-20 MG/5ML PO SUSP
30.0000 mL | ORAL | Status: DC | PRN
  Administered 2022-07-16 – 2022-07-20 (×4): 30 mL via ORAL
  Filled 2022-07-16 (×4): qty 30

## 2022-07-16 NOTE — Progress Notes (Signed)
  Transition of Care [facility]) Screening Note   Patient Details  Name: Jeffrey Watson Date of Birth: [birth year]   Transition of Care [facility]) CM/SW Contact:    Kimber Relic, LCSW Phone Number: 07/16/2022, 11:14 AM    Transition of Care Department [facility]) has reviewed patient and no TOC needs have been identified at this time. We will continue to monitor patient advancement through interdisciplinary progression rounds. If new patient transition needs arise, please place a TOC consult.

## 2022-07-16 NOTE — Consult Note (Signed)
Consultation Note Date: 07/16/2022   Patient Name: Jeffrey Watson  DOB: [birth year]  MRN: [id number]  Age / Sex: 81 y.o., male  PCP: System, Provider Not In Referring Physician: Mercy Riding, MD  Reason for Consultation: Establishing goals of care  HPI/Patient Profile: 81 y.o. 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  Patient diagnosed with sepsis related to aspiration pneumonia also with concern for SBP.  Patient's last chemotherapy 10/19.  Imaging raises concerns for peritoneal and hepatic metastasis. PMT consulted to discuss [place].   Clinical Assessment and Goals of Care: I have reviewed medical records including EPIC notes, labs and imaging, received report from RN, assessed the patient and then met with patient  to discuss diagnosis prognosis, GOC, EOL wishes, disposition and options.  I introduced Palliative Medicine as specialized medical care for people living with serious illness. It focuses on providing relief from the symptoms and stress of a serious illness. The goal is to improve quality of life for both the patient and the family.  We discussed a brief life review of the patient.  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  He was born in [place] and raised in [place].  He has 4 daughters and 1 son.  His wife Jeffrey Watson is his second wife and they have been married over 58 years.  As far as functional and nutritional status he tells me he can complete ADLs independently.  He tells me he has had an okay appetite.   We discussed patient's current illness and what it means in the larger context of patient's on-going  co-morbidities.  Natural disease trajectory and expectations at EOL were discussed.  We discussed his cancer.  I asked him to tell me about his last oncology appointment but he tells me he cannot really recall.  I asked him what he has heard about his latest abdominal scans and he also tells me he cannot recall.  I tell him about concern of cancer spread and he tells me he has not heard this before.  He tells me he has been tolerating chemotherapy well.  I attempted to elicit values and goals of care important to the patient.    The difference between aggressive medical intervention and comfort care was considered in light of the patient's goals of care.   We discuss code status. Patient is quite clear in his desire for full code status.   Discussed with patient the importance of continued conversation with family and the medical providers regarding overall plan of care and treatment options, ensuring decisions are within the context of the patient's values and GOCs.    Patient confirms if he could not make decisions for himself he would want his wife Jeffrey Watson to make decisions for him.   Patient currently denies all symptoms - no pain, N/V, shortness or breath.  Patient agreeable to spiritual care consult.   Questions and concerns were addressed. Patient gives me permission to call spouse.  Spoke with wife Jeffrey Watson via telephone. She tells me she feels she does not have good understanding of patient situation. She tells me she does not go to patient's oncology appointments and so has not received update from oncologist. We discuss patient's lung cancer with  concern for metastasis - she has not heard this yet. We discuss plan for CTA later today. We discuss that he is currently stable and being treated for infection.   Jeffrey Watson tells me she feels the patient has been declining at home. She tells me she has noticed increasing confusion as well as rapid weight loss. She tells me she feels he has been  "slipping away".   She tells me patient's mother just passed away earlier this week and she feels the patient is in the middle of acute grief. She tells me they have not had the funeral yet and she is trying to figure out when she could get patient to funeral. We discuss urgent issues are treat the patient's infection. I encouraged her to go to patient's oncology appointment at next follow up.   She tells me she wonders if hospice should be involved.   I share with her about patient's desire for full code status and she tells me this is consistent with his wishes in the past.    Primary Decision Maker PATIENT joined by spouse Jeffrey Watson - some concern about confusion in patient though he is certainly able to participate [place] conversations his ability to retain information and understand how decisions affect his medical care is uncertain   SUMMARY OF RECOMMENDATIONS   Initial goals of care discussion Patient and family updated on current medical situation and plan Patient requests full code status, wife agrees Wife does express potential need for hospice though at this point unclear if in line with patient's goal, needs further discussion Encouraged wife to accompany patient to oncologist visits Chaplain consult for spiritual support Patient denies symptom management concerns Wife reports she does not want patient to go to rehab, wants him to come home   Code Status/Advance Care Planning: Full code     Primary Diagnoses: Present on Admission:  Severe sepsis with thrombocytopenia ([place])  Essential hypertension  COPD, mild (HCC)  BPH (benign prostatic hyperplasia)  Non-small cell lung cancer (NSCLC) ([place])  Centrilobular emphysema ([place])  Adenocarcinoma of right lung ([place])  Acute on chronic respiratory failure with hypoxia and hypercapnia ([place])  Current every day smoker   I have reviewed the medical record, interviewed the patient and family, and examined the patient. The following  aspects are pertinent.  Past Medical History:  Diagnosis Date   Cancer ([place])    Hypertension    Pneumonia    Social History   Socioeconomic History   Marital status: Unknown    Spouse name: Not on file   Number of children: Not on file   Years of education: Not on file   Highest education level: Not on file  Occupational History   Not on file  Tobacco Use   Smoking status: Every Day    Packs/day: 0.50    Years: 60.00    Total pack years: 30.00    Types: Cigarettes   Smokeless tobacco: Never  Vaping Use   Vaping Use: Never used  Substance and Sexual Activity   Alcohol use: Not Currently    Alcohol/week: 5.0 standard drinks of alcohol    Types: 5 Standard drinks or equivalent per week   Drug use: No   Sexual activity: Not Currently  Other Topics Concern   Not on file  Social History Narrative   Not on file   Social Determinants of Health   Financial Resource Strain: Not on file  Food Insecurity: No Food Insecurity (07/15/2022)   Hunger Vital Sign  Worried About Charity fundraiser in the Last Year: Never true    [place] in the Last Year: Never true  Transportation Needs: No Transportation Needs (07/15/2022)   PRAPARE - Hydrologist (Medical): No    Lack of Transportation (Non-Medical): No  Physical Activity: Not on file  Stress: Not on file  Social Connections: Not on file   Family History  Problem Relation Age of Onset   Heart attack Mother    Stomach cancer Other        Grandma   Stroke Other        Grandma   Scheduled Meds:  Chlorhexidine Gluconate Cloth  6 each Topical Daily   nicotine  21 mg Transdermal Daily   Continuous Infusions:  ceFEPime (MAXIPIME) IV 2 g (07/16/22 1206)   metronidazole 500 mg (07/16/22 0500)   PRN Meds:.acetaminophen **OR** acetaminophen, bisacodyl, HYDROmorphone (DILAUDID) injection, ipratropium-albuterol, ondansetron **OR** ondansetron (ZOFRAN) IV, oxyCODONE, polyethylene glycol,  sodium chloride flush Allergies  Allergen Reactions   Eggs Or Egg-Derived Products Nausea And Vomiting   Influenza Vaccines Nausea And Vomiting   Lisinopril Other (See Comments)    Reports chest pain with lisinopril   Midazolam Other (See Comments)    Did not tolerate for moderate sedation with port revision and chest tube placement on 09/28/21. Unable to follow commands and severely agitated.    Penicillins Diarrhea    Has patient had a PCN reaction causing immediate rash, facial/tongue/throat swelling, SOB or lightheadedness with hypotension: No Has patient had a PCN reaction causing severe rash involving mucus membranes or skin necrosis: No Has patient had a PCN reaction that required hospitalization: No Has patient had a PCN reaction occurring within the last 10 years: No If all of the above answers are "NO", then may proceed with Cephalosporin use.    Review of Systems  Constitutional:  Negative for activity change, appetite change and fatigue.  Respiratory:  Negative for shortness of breath.   Gastrointestinal:  Negative for abdominal pain.  Neurological:  Negative for weakness.    Physical Exam Constitutional:      General: He is not in acute distress.    Appearance: He is ill-appearing.  Pulmonary:     Effort: Pulmonary effort is normal.  Skin:    General: Skin is warm and dry.  Neurological:     Mental Status: He is alert and oriented to person, place, and time.     Vital Signs: BP 114/63 (BP Location: Left Arm)   Pulse 91   Temp 97.9 F (36.6 C) (Oral)   Resp 20   Ht _0  (1.702 m)   Wt 46.3 kg   SpO2 94%   BMI 15.99 kg/m  Pain Scale: 0-10   Pain Score: 0-No pain   SpO2: SpO2: 94 % O2 Device:SpO2: 94 % O2 Flow Rate: .O2 Flow Rate (L/min): 4 L/min  IO: Intake/output summary:  Intake/Output Summary (Last 24 hours) at 07/16/2022 1316 Last data filed at 07/16/2022 1206 Gross per 24 hour  Intake 2449.16 ml  Output 975 ml  Net 1474.16 ml    LBM:  Last BM Date : 07/14/22 Baseline Weight: Weight: 46.3 kg Most recent weight: Weight: 46.3 kg     Palliative Assessment/Data: PPS 50%     *Please note that this is a verbal dictation therefore any spelling or grammatical errors are due to the "[place] [facility]" system interpretation.  Juel Burrow, DNP, AGNP-C Palliative Medicine Team [phone]  Pager: [phone]

## 2022-07-16 NOTE — Evaluation (Signed)
Clinical/Bedside Swallow Evaluation Patient Details  Name: Govind Furey MRN: [id number] Date of Birth: 27-Oct-1940  Today's Date: 07/16/2022 Time: SLP Start Time (ACUTE ONLY): 56 SLP Stop Time (ACUTE ONLY): 1048 SLP Time Calculation (min) (ACUTE ONLY): 18 min  Past Medical History:  Past Medical History:  Diagnosis Date   Cancer ([place])    Hypertension    Pneumonia    Past Surgical History:  Past Surgical History:  Procedure Laterality Date   HERNIA REPAIR     LEFT HEART CATH AND CORONARY ANGIOGRAPHY N/A 03/05/2018   Procedure: LEFT HEART CATH AND CORONARY ANGIOGRAPHY;  Surgeon: Lorretta Harp, MD;  Location: [place] [facility];  Service: Cardiovascular;  Laterality: N/A;   TONSILLECTOMY  09/20/1979   HPI:  Savan Ruta is a 81 y.o. 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  Patient was discharged from ED to follow-up with his oncologist. Patient had his palliative chemotherapy on 10/19.  Per EDP, patient presented with shortness of breath, nausea, vomiting and hypoxia to low 80s on room air for which he was put on 4 L by EMS brought to ED; ST consulted to assess swallowing function via BSE.    Assessment / Plan / Recommendation  Clinical Impression  Pt seen for clinical swallowing evaluation with low vocal intensity noted prior to limited PO intake and reports of "vomiting intermittently  when consuming liquids" per pt report.   OME unremarkable.  Pt consumed small amounts of thin via cup/straw, ice chips and puree consistencies only d/t diet restrictions related to GI issues per MD request.  Pt did not exhibit any overt s/s of aspiration with SLP during this trial, but this is a small window of opportunity/observation and pt may be exhibiting dysphagic symptoms during meals/larger snacks.  Recommend continue Full Liquid diet (per MD d/t medical reasons) with general swallowing precautions in place.  ST will continue to f/u in acute setting for progression of diet (when medically stable) and education re: dysphagia management.  Thank you for this consult. SLP Visit Diagnosis: Dysphagia, unspecified (R13.10)    Aspiration Risk  Mild aspiration risk    Diet Recommendation   Full liquids   Medication Administration: Other (Comment) (medication via liquids or puree (one at a time))    Other  Recommendations Oral Care Recommendations: Oral care BID;Patient independent with oral care    Recommendations for follow up therapy are one component of a multi-disciplinary discharge planning process, led by the attending physician.  Recommendations may be updated based on patient status, additional functional criteria and insurance authorization.  Follow up Recommendations Follow physician's recommendations for discharge plan and follow up therapies      Assistance Recommended at Discharge Intermittent Supervision/Assistance  Functional Status Assessment Patient has had a recent decline in their functional status and demonstrates the ability to make significant improvements in function in a reasonable and predictable amount of time.  Frequency and Duration min 2x/week  1 week  Prognosis Prognosis for Safe Diet Advancement: Good      Swallow Study   General Date of Onset: 07/15/22 HPI: Hadriel Northup is a 81 y.o. 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  Patient was discharged from ED to follow-up with his oncologist. Patient had his palliative chemotherapy on 10/19.  Per EDP, patient presented with shortness of breath, nausea, vomiting and hypoxia to low 80s on room air for which he was put on 4 L by EMS brought to ED; ST consulted to assess swallowing function via BSE. Type of Study: Bedside Swallow Evaluation Previous Swallow Assessment: n/a Diet Prior to this Study: Thin liquids (Full) Temperature Spikes Noted: No Respiratory Status: Nasal cannula (4L) History of Recent Intubation: No Behavior/Cognition: Alert;Cooperative Oral Cavity Assessment: Within Functional Limits Oral Care Completed by SLP: No Oral Cavity - Dentition: Edentulous Vision: Functional for self-feeding Self-Feeding Abilities: Able to feed self;Needs assist Patient Positioning: Upright in bed Baseline Vocal Quality: Low vocal intensity Volitional Cough: Strong Volitional Swallow: Able to elicit    Oral/Motor/Sensory Function Overall Oral Motor/Sensory Function: Within functional limits   Ice Chips Ice chips: Within functional limits Presentation: Self Fed   Thin Liquid Thin Liquid: Impaired Presentation: Self Fed;Straw (small amounts) Pharyngeal  Phase Impairments: Other (comments) (denotes intermittent vomiting/regurgitation)    Nectar Thick Nectar Thick Liquid: Not tested   Honey Thick Honey  Thick Liquid: Not tested   Puree Puree: Within functional limits Presentation: Self Fed   Solid     Solid: Not tested      Elvina Sidle, M.S., CCC-SLP 07/16/2022,1:52 PM

## 2022-07-16 NOTE — Progress Notes (Addendum)
PROGRESS NOTE  Jeffrey Watson FFM:[id number] DOB: January 07, 1941   PCP: System, Provider Not In  Patient is from: Home.  Lives with family.  Independently ambulates at baseline  DOA: 07/15/2022 LOS: 1  Chief complaints Chief Complaint  Patient presents with   Shortness of Breath   Nausea   Emesis     Brief Narrative / Interim history: 81 y.o. 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  Cultures obtained.  He was started on broad-spectrum antibiotics.  CT chest, abdomen and pelvis negative for PE but showed distal esophageal wall thickening concerning for esophagitis, emphysema, RLL infiltrate/atelectasis, large ascites, omental nodularity concerning for metastasis and possible small pelvic hemorrhage.  Patient had paracentesis with removal of 2.2 bloody peritoneal fluid.  Blood cultures NGTD.  MRSA PCR negative.  Significant drop in Hgb but seems to have reached nadir at 7.4.    Subjective: Seen and examined earlier this morning.  He had an episode of chest pain and shortness of breath overnight.  Symptoms resolved by the time the overnight provider assessed at bedside.  Feels better this morning.  He reports "a little" abdominal pain but rates it 7/10.  Does not appear to be in mild distress.  Denies shortness of breath or chest pain.  Has not had a bowel movement yet.  Has some regurgitation and vomiting after drinking coffee.  Working with SLP currently  Objective: Vitals:   07/16/22 0134 07/16/22 0237 07/16/22 0500 07/16/22 0601  BP:  105/67   114/63  Pulse:  94  91  Resp:  20  20  Temp:  98 F (36.7 C)  97.9 F (36.6 C)  TempSrc:  Oral  Oral  SpO2:  95%  94%  Weight:   46.3 kg   Height: 5\' 7"  (1.702 m)       Examination:  GENERAL: Appears frail.  No apparent distress. HEENT: MMM.  Vision and hearing grossly intact.  NECK: Supple.  No apparent JVD.  RESP:  No IWOB.  Fair aeration bilaterally. CVS:  RRR. Heart sounds normal.  ABD/GI/GU: BS+. Abd soft and slightly full.  No tenderness to palpation. MSK/EXT:  Moves extremities.  Significant muscle mass and subcu fat loss. SKIN: no apparent skin lesion or wound NEURO: Awake, alert and oriented appropriately.  No apparent focal neuro deficit. PSYCH: Calm. Normal affect.   Procedures:  10/27-paracentesis with removal of 2.2 L  Microbiology summarized: [id number] and influenza PCR nonreactive MRSA PCR screen nonreactive Blood cultures NGTD Respiratory and peritoneal cultures pending   Assessment and plan: Principal Problem:   Severe sepsis with thrombocytopenia (HCC) Active Problems:   BPH (benign prostatic hyperplasia)   Essential hypertension   COPD, mild (HCC)   Acute on chronic respiratory failure with hypoxia and hypercapnia (HCC)   Adenocarcinoma of right lung (HCC)   Centrilobular emphysema (HCC)   Current every day smoker   Non-small cell lung cancer (NSCLC) (HCC)   Pancytopenia (HCC)   Ascites   Esophagitis   SBP (spontaneous bacterial peritonitis) ([place])   Failure to thrive in adult   Aspiration pneumonia ([place])   AKI (acute kidney injury) ([place])  Acute blood loss anemia   Azotemia  Severe sepsis with thrombocytopenia: POA.  Suspect aspiration pneumonia and SBP.  Peritoneal fluid was cloudy.  Unable to perform cell count due to blood clot and fluid.  Patient is immunocompromised.  Blood cultures NGTD.  MRSA PCR nonreactive.  Lactic acidosis resolved.  Pro-Cal improved hemodynamically stable.  -Discontinue vancomycin -Continue cefepime, Flagyl and  Zithromax -Follow sputum and peritoneal cultures -Aspiration precaution -SLP eval and treat -Continue Protonix   Stage IV lung cancer/cancer related pain: Followed with [facility]ology team.  On palliative chemo with  Pembrolizumab and Pemetrexed.  Last treatment on 10/19.  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  Seems to have episodic dyspnea.  Currently on room air.  Lung sounds improved. -Continue DuoNebs as needed -Continue LABA/LAMA/ICS-not compliant with this at home. 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  Peritoneal fluid with blood clot to calculate cell counts. SAAG -0.6 arguing against PHTN. -Antibiotics as above pending culture -Follow-up fluid cytology   Acute blood loss anemia/possible intra-abdominal bleed: CT showed small volume pelvic hemorrhage.  Peritoneal fluid reportedly bloody.  He also had chemotherapy on 10/19.  Hgb dropped about 3 g but seems to have reachd nadir at 7.4.  Bilirubin within normal.  He is hemodynamically stable.  Feels better Recent Labs    07/04/22 1313 07/15/22 1038 07/15/22 1905 07/16/22 0406 07/16/22 0903  HGB 10.8* 9.6* 9.0* 7.6* 7.4*  -Closely monitor H&H -SCD for VTE prophylaxis -Transfuse for Hgb less than 7.0.  Verbally consented for blood transfusion. -Stat CT angio if significant drop or hemodynamic instability  AKI/azotemia: Baseline Cr 0.9-1.0.  Improving. Recent Labs    07/04/22 1313 07/15/22 1216 07/15/22 1904 07/16/22 0406  BUN 9 43* 46* 42*  CREATININE 1.03 0.38* 1.61* 1.32*  -Avoid nephrotoxic meds -Monitor  Pancytopenia: Due to chemotherapy? -Continue monitoring   Dysphagia/possible  esophagitis/distal esophageal wall thickening -Aspiration precaution, SLP eval and PPI   Goal of care counseling: See discussion from 10/27.  Patient provided me with his daughter's number, [phone] but she did not answer.  Prefers to remain full code -Palliative medicine consulted   Hypernatremia: Likely erroneous lab.  Resolved -Recheck BMP   Hyperkalemia-hemolyzed blood.   Failure to thrive/severe malnutrition/generalized weakness -PT/OT eval -Palliative medicine consult -Cell dietitian Body mass index is 15.99 kg/m.          DVT prophylaxis:  SCDs Start: 07/15/22 1603  Code Status: Full code Family Communication: Daughter did not answer when I attempted to call using the number provided. Level of care: Progressive Status is: Inpatient Remains inpatient appropriate because: Acute blood loss anemia, severe sepsis, aspiration pneumonia and SBP   Final disposition: TBD Consultants:  Palliative medicine  Sch Meds:  Scheduled Meds:  Chlorhexidine Gluconate Cloth  6 each Topical Daily   nicotine  21 mg Transdermal Daily   Continuous Infusions:  ceFEPime (MAXIPIME) IV 2 g (07/16/22 1206)   metronidazole 500 mg (07/16/22 0500)   PRN Meds:.acetaminophen **OR** acetaminophen, bisacodyl, HYDROmorphone (DILAUDID) injection, ipratropium-albuterol, ondansetron **OR** ondansetron (ZOFRAN) IV, oxyCODONE, polyethylene glycol, sodium chloride flush  Antimicrobials: Anti-infectives (From admission, onward)    Start     Dose/Rate Route Frequency Ordered Stop   07/16/22 1700  vancomycin (VANCOREADY) IVPB  1250 mg/250 mL  Status:  Discontinued        1,250 mg 166.7 mL/hr over 90 Minutes Intravenous Every 24 hours 07/15/22 1528 07/15/22 1918   07/16/22 1000  ceFEPIme (MAXIPIME) 2 g in sodium chloride 0.9 % 100 mL IVPB        2 g 200 mL/hr over 30 Minutes Intravenous Every 12 hours 07/15/22 2000     07/15/22 2100  vancomycin (VANCOREADY) IVPB 1250 mg/250 mL  Status:  Discontinued         1,250 mg 166.7 mL/hr over 90 Minutes Intravenous Every 48 hours 07/15/22 2000 07/16/22 1123   07/15/22 2000  vancomycin (VANCOREADY) IVPB 1250 mg/250 mL  Status:  Discontinued        1,250 mg 166.7 mL/hr over 90 Minutes Intravenous Every 24 hours 07/15/22 1918 07/15/22 2000   07/15/22 1600  metroNIDAZOLE (FLAGYL) IVPB 500 mg        500 mg 100 mL/hr over 60 Minutes Intravenous Every 12 hours 07/15/22 1518     07/15/22 1530  ceFEPIme (MAXIPIME) 2 g in sodium chloride 0.9 % 100 mL IVPB  Status:  Discontinued        2 g 200 mL/hr over 30 Minutes Intravenous Every 8 hours 07/15/22 1522 07/15/22 2000   07/15/22 1530  vancomycin (VANCOREADY) IVPB 1250 mg/250 mL  Status:  Discontinued        1,250 mg 166.7 mL/hr over 90 Minutes Intravenous  Once 07/15/22 1523 07/15/22 1918   07/15/22 1500  cefTRIAXone (ROCEPHIN) 1 g in sodium chloride 0.9 % 100 mL IVPB  Status:  Discontinued        1 g 200 mL/hr over 30 Minutes Intravenous  Once 07/15/22 1452 07/15/22 1522   07/15/22 1500  azithromycin (ZITHROMAX) 500 mg in sodium chloride 0.9 % 250 mL IVPB  Status:  Discontinued        500 mg 250 mL/hr over 60 Minutes Intravenous  Once 07/15/22 1452 07/16/22 1127        I have personally reviewed the following labs and images: CBC: Recent Labs  Lab 07/15/22 1038 07/15/22 1905 07/16/22 0406 07/16/22 0903  WBC 3.4*  --  3.7*  --   NEUTROABS 2.0  --   --   --   HGB 9.6* 9.0* 7.6* 7.4*  HCT 30.2* 28.9* 24.5* 23.6*  MCV 75.9*  --  76.3*  --   PLT 111*  --  113*  --    BMP &GFR Recent Labs  Lab 07/15/22 1216 07/15/22 1904 07/16/22 0406  NA 154* 138 141  K 5.6* 4.0 3.9  CL 84* 94* 100  CO2 27 34* 35*  GLUCOSE 132* 163* 132*  BUN 43* 46* 42*  CREATININE 0.38* 1.61* 1.32*  CALCIUM 6.8* 8.3* 8.3*  PHOS  --  4.9*  --    Estimated Creatinine Clearance: 29.2 mL/min (A) (by C-G formula based on SCr of 1.32 mg/dL (H)). Liver & Pancreas: Recent Labs  Lab 07/15/22 1904 07/16/22 0406  AST   --  19  ALT  --  10  ALKPHOS  --  46  BILITOT  --  0.7  PROT  --  5.8*  ALBUMIN 2.5* 2.2*   No results for input(s): "LIPASE", "AMYLASE" in the last 168 hours. No results for input(s): "AMMONIA" in the last 168 hours. Diabetic: No results for input(s): "HGBA1C" in the last 72 hours. No results for input(s): "GLUCAP" in the last 168 hours. Cardiac Enzymes: No results for input(s): "CKTOTAL", "CKMB", "  CKMBINDEX", "TROPONINI" in the last 168 hours. No results for input(s): "PROBNP" in the last 8760 hours. Coagulation Profile: No results for input(s): "INR", "PROTIME" in the last 168 hours. Thyroid Function Tests: No results for input(s): "TSH", "T4TOTAL", "FREET4", "T3FREE", "THYROIDAB" in the last 72 hours. Lipid Profile: No results for input(s): "CHOL", "HDL", "LDLCALC", "TRIG", "CHOLHDL", "LDLDIRECT" in the last 72 hours. Anemia Panel: No results for input(s): "VITAMINB12", "FOLATE", "FERRITIN", "TIBC", "IRON", "RETICCTPCT" in the last 72 hours. Urine analysis:    Component Value Date/Time   COLORURINE YELLOW 07/15/2022 1002   APPEARANCEUR HAZY (A) 07/15/2022 1002   LABSPEC 1.023 07/15/2022 1002   PHURINE 5.0 07/15/2022 1002   GLUCOSEU NEGATIVE 07/15/2022 1002   HGBUR NEGATIVE 07/15/2022 1002   BILIRUBINUR NEGATIVE 07/15/2022 1002   KETONESUR NEGATIVE 07/15/2022 1002   PROTEINUR NEGATIVE 07/15/2022 1002   NITRITE NEGATIVE 07/15/2022 1002   LEUKOCYTESUR NEGATIVE 07/15/2022 1002   Sepsis Labs: Invalid input(s): "PROCALCITONIN", "LACTICIDVEN"  Microbiology: Recent Results (from the past 240 hour(s))  Resp Panel by RT-PCR (Flu A&B, Covid) Anterior Nasal Swab     Status: None   Collection Time: 07/15/22  9:52 AM   Specimen: Anterior Nasal Swab  Result Value Ref Range Status   SARS Coronavirus 2 by RT PCR NEGATIVE NEGATIVE Final    Comment: (NOTE) SARS-CoV-2 target nucleic acids are NOT DETECTED.  The SARS-CoV-2 RNA is generally detectable in upper respiratory specimens  during the acute phase of infection. The lowest concentration of SARS-CoV-2 viral copies this assay can detect is 138 copies/mL. A negative result does not preclude SARS-Cov-2 infection and should not be used as the sole basis for treatment or other patient management decisions. A negative result may occur with  improper specimen collection/handling, submission of specimen other than nasopharyngeal swab, presence of viral mutation(s) within the areas targeted by this assay, and inadequate number of viral copies(<138 copies/mL). A negative result must be combined with clinical observations, patient history, and epidemiological information. The expected result is Negative.  Fact Sheet for Patients:  EntrepreneurPulse.com.au  Fact Sheet for Healthcare Providers:  IncredibleEmployment.be  This test is no t yet approved or cleared by the [place] FDA and  has been authorized for detection and/or diagnosis of SARS-CoV-2 by FDA under an Emergency Use Authorization (EUA). This EUA will remain  in effect (meaning this test can be used) for the duration of the COVID-19 declaration under Section 564(b)(1) of the Act, 21 U.S.C.section 360bbb-3(b)(1), unless the authorization is terminated  or revoked sooner.       Influenza A by PCR NEGATIVE NEGATIVE Final   Influenza B by PCR NEGATIVE NEGATIVE Final    Comment: (NOTE) The Xpert Xpress SARS-CoV-2/FLU/RSV plus assay is intended as an aid in the diagnosis of influenza from Nasopharyngeal swab specimens and should not be used as a sole basis for treatment. Nasal washings and aspirates are unacceptable for Xpert Xpress SARS-CoV-2/FLU/RSV testing.  Fact Sheet for Patients: EntrepreneurPulse.com.au  Fact Sheet for Healthcare Providers: IncredibleEmployment.be  This test is not yet approved or cleared by the [place] FDA and has been authorized for detection  and/or diagnosis of SARS-CoV-2 by FDA under an Emergency Use Authorization (EUA). This EUA will remain in effect (meaning this test can be used) for the duration of the COVID-19 declaration under Section 564(b)(1) of the Act, 21 U.S.C. section 360bbb-3(b)(1), unless the authorization is terminated or revoked.  Performed at [facility], [place] [address] [place], [place] [address]   Gram stain  Status: None   Collection Time: 07/15/22  4:37 PM   Specimen: Fluid  Result Value Ref Range Status   Specimen Description FLUID PERITONEAL  Final   Special Requests NONE  Final   Gram Stain   Final    WBC SEEN NO ORGANISMS SEEN CYTOSPIN SMEAR Gram Stain Report Called to,Read Back By and Verified With: [email or web address],G AT 2138 ON 07/15/22 BY LUZOLOP Performed at [facility], [place] [address], [place], [place] [address]    Report Status 07/15/2022 FINAL  Final  Body fluid culture w Gram Stain     Status: None (Preliminary result)   Collection Time: 07/15/22  4:37 PM   Specimen: Fluid  Result Value Ref Range Status   Specimen Description   Final    FLUID PERITONEAL Performed at [place] [address] [place], [place] [address]    Special Requests   Final    NONE Performed at [facility], [place] [address], [place], [place] [address]    Gram Stain   Final    NO WBC SEEN NO ORGANISMS SEEN Performed at [facility], [place] [address], [place], [place] [address]    Culture PENDING  Incomplete   Report Status PENDING  Incomplete  Culture, blood (routine x 2)     Status: None (Preliminary result)   Collection Time: 07/15/22  7:04 PM   Specimen: BLOOD  Result Value Ref Range Status   Specimen Description   Final    BLOOD RIGHT ANTECUBITAL Performed at [place] [address], [place], [place] [address]    Special Requests   Final    BOTTLES DRAWN AEROBIC ONLY Blood Culture adequate  volume Performed at [place] [address] [place], [place] [address]    Culture   Final    NO GROWTH < 12 HOURS Performed at [place] [address], [place], [place] [address]    Report Status PENDING  Incomplete  Culture, blood (routine x 2)     Status: None (Preliminary result)   Collection Time: 07/15/22  7:06 PM   Specimen: BLOOD LEFT HAND  Result Value Ref Range Status   Specimen Description   Final    BLOOD LEFT HAND Performed at [facility], [place] [address], [place], [place] [address]    Special Requests   Final    BOTTLES DRAWN AEROBIC ONLY Blood Culture adequate volume Performed at [place] [address] [place], [place] [address]    Culture   Final    NO GROWTH < 12 HOURS Performed at [place] [address] [place],  [address]    Report Status PENDING  Incomplete  MRSA Next Gen by PCR, Nasal     Status: None   Collection Time: 07/15/22  8:51 PM   Specimen: Nasal Mucosa; Nasal Swab  Result Value Ref Range Status   MRSA by PCR Next Gen NOT DETECTED NOT DETECTED Final    Comment: (NOTE) The GeneXpert MRSA Assay (FDA approved for NASAL specimens only), is one component of a comprehensive MRSA colonization surveillance program. It is not intended to diagnose MRSA infection nor to guide or monitor treatment for MRSA infections. Test performance is not FDA approved in patients less than 84 years old. Performed at [facility], [place] [address] [place],  [address]   Culture, Respiratory w Gram Stain     Status: None (  Preliminary result)   Collection Time: 07/16/22  2:25 AM   Specimen: Expectorated Sputum; Respiratory  Result Value Ref Range Status   Specimen Description   Final    EXPECTORATED SPUTUM Performed at [place] [address], [place], [place] [address]    Special Requests   Final    Immunocompromised Performed at  [facility], [place] [address] [place], [place] [address]    Gram Stain   Final    RARE SQUAMOUS EPITHELIAL CELLS PRESENT RARE GRAM VARIABLE ROD RARE GRAM POSITIVE COCCI IN PAIRS Performed at [facility], [place] [address], [place], [place] [address]    Culture PENDING  Incomplete   Report Status PENDING  Incomplete    Radiology Studies: US Paracentesis  Result Date: 07/15/2022 INDICATION: Patient with history of stage IV lung cancer, recent right inguinal hernia repair, COPD, sepsis, ascites. Request received for diagnostic and therapeutic paracentesis. EXAM: ULTRASOUND GUIDED DIAGNOSTIC AND THERAPEUTIC PARACENTESIS MEDICATIONS: 8 mL 1% lidocaine COMPLICATIONS: None immediate. PROCEDURE: Informed written consent was obtained from the patient after a discussion of the risks, benefits and alternatives to treatment. A timeout was performed prior to the initiation of the procedure. Initial ultrasound scanning demonstrates a small to moderate amount of ascites within the left lower abdominal quadrant. The left lower abdomen was prepped and draped in the usual sterile fashion. 1% lidocaine was used for local anesthesia. Following this, a 19 gauge, 10-cm, Yueh catheter was introduced. An ultrasound image was saved for documentation purposes. The paracentesis was performed. The catheter was removed and a dressing was applied. The patient tolerated the procedure well without immediate post procedural complication. FINDINGS: A total of approximately 2.2 liters of bloody fluid was removed. Samples were sent to the laboratory as requested by the clinical team. IMPRESSION: Successful ultrasound-guided diagnostic and therapeutic paracentesis yielding 2.2 liters of peritoneal fluid. Read by: Rowe Robert, PA-C Electronically Signed   By: Markus Daft M.D.   On: 07/15/2022 17:37      Davan Hark T. [place]  If 7PM-7AM, please contact night-coverage [email or web address] 07/16/2022,  2:19 PM

## 2022-07-16 NOTE — Evaluation (Signed)
Physical Therapy Evaluation Patient Details Name: Jeffrey Watson MRN: [id number] DOB: [birth year] Today's Date: 07/16/2022  History of Present Illness  Pt is an 81yo male presenting to [facility] ED on 07/15/22 with complaints of abdominal pain. Pt is s/p R inguinal hernia repair 05/27/22. 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 s/p paracentesis on 10/27 yielding 2.2L. PMH: Stage IV lung cancer on palliative chemotherapy (last 10/19), COPD on 2L, ascites, HTN, tobacco use.   Clinical Impression  Patient evaluated by Physical Therapy with no further acute PT needs identified. All education has been completed and the patient has no further questions. Pt modified independent for bed mobility and transfers, min guard for ambulation in hallway 258ft. Pt reports he is at his baseline and will have appropriate level of assistance from family at home. See below for any follow-up Physical Therapy or equipment needs. PT is signing off. Thank you for this referral.       Recommendations for follow up therapy are one component of a multi-disciplinary discharge planning process, led by the attending physician.  Recommendations may be updated based on patient status, additional functional criteria and insurance authorization.  Follow Up Recommendations No PT follow up      Assistance Recommended at Discharge Set up Supervision/Assistance  Patient can return home with the following  A little help with walking and/or transfers;A little help with bathing/dressing/bathroom;Assistance with cooking/housework;Assist for transportation;Help with stairs or ramp for entrance    Equipment Recommendations None recommended by PT  Recommendations for Other Services       Functional Status Assessment Patient has not had a recent decline in their functional status     Precautions / Restrictions Precautions Precautions: Fall Restrictions Weight Bearing Restrictions: No      Mobility  Bed  Mobility Overal bed mobility: Modified Independent             General bed mobility comments: increased time    Transfers Overall transfer level: Modified independent Equipment used: None               General transfer comment: increased time    Ambulation/Gait Ambulation/Gait assistance: Min guard Gait Distance (Feet): 240 Feet Assistive device: IV Pole Gait Pattern/deviations: WFL(Within Functional Limits) Gait velocity: decreased     General Gait Details: Pt ambualted with one hand on IV pole, min guard, no physical assist required or overt LOB noted.  Stairs            Wheelchair Mobility    Modified Rankin (Stroke Patients Only)       Balance Overall balance assessment: Mild deficits observed, not formally tested                                           Pertinent Vitals/Pain Pain Assessment Pain Assessment: No/denies pain    Home Living Family/patient expects to be discharged to:: Private residence Living Arrangements: Spouse/significant other Available Help at Discharge: Family;Available 24 hours/day Type of Home: Apartment Home Access: Level entry       Home Layout: One level Home Equipment: None      Prior Function Prior Level of Function : Independent/Modified Independent             Mobility Comments: IND ADLs Comments: IND     Hand Dominance        Extremity/Trunk Assessment   Upper Extremity Assessment Upper Extremity Assessment: Overall  WFL for tasks assessed    Lower Extremity Assessment Lower Extremity Assessment: Overall WFL for tasks assessed    Cervical / Trunk Assessment Cervical / Trunk Assessment: Normal  Communication   Communication: HOH  Cognition Arousal/Alertness: Awake/alert Behavior During Therapy: WFL for tasks assessed/performed Overall Cognitive Status: Within Functional Limits for tasks assessed                                          General  Comments      Exercises     Assessment/Plan    PT Assessment Patient does not need any further PT services  PT Problem List Decreased strength;Decreased activity tolerance       PT Treatment Interventions      PT Goals (Current goals can be found in the Care Plan section)       Frequency       Co-evaluation               AM-PAC PT "6 Clicks" Mobility  Outcome Measure Help needed turning from your back to your side while in a flat bed without using bedrails?: None Help needed moving from lying on your back to sitting on the side of a flat bed without using bedrails?: None Help needed moving to and from a bed to a chair (including a wheelchair)?: None Help needed standing up from a chair using your arms (e.g., wheelchair or bedside chair)?: A Little Help needed to walk in hospital room?: A Little Help needed climbing 3-5 steps with a railing? : A Little 6 Click Score: 21    End of Session Equipment Utilized During Treatment: Gait belt;Oxygen (4L) Activity Tolerance: Patient tolerated treatment well Patient left: in bed;with call bell/phone within reach;with bed alarm set Nurse Communication: Mobility status PT Visit Diagnosis: Unsteadiness on feet (R26.81)    Time: 8329-1916 PT Time Calculation (min) (ACUTE ONLY): 23 min   Charges:   PT Evaluation $PT Eval Low Complexity: [place], PT, DPT [facility] Department Office: [phone] Weekend pager: [phone]  Coolidge Breeze 07/16/2022, 3:57 PM

## 2022-07-16 NOTE — Progress Notes (Signed)
Patient complained of chest pains while eating. 10/10 pain scale described as aching pains. Vitals are good except for SpO2 87 % hes mouth breathing. EKG was done and uploaded. I gave him some Dilaudid 0.5mg  iv and after that chest pain improves. 02 saturation is now ranging from 94-95% at 7LPM nasal cannula. On call nurse practitioner was made aware of the situation.

## 2022-07-16 NOTE — Progress Notes (Signed)
       CROSS COVER NOTE  NAME: Jeffrey Watson MRN: [id number] DOB : 1941/06/07  At bedside, pt denies chest pain or any discomfort. Initial EKG with low voltage. 2nd EKG read as NSR. No further interventions.   Raenette Rover, DNP, [place]

## 2022-07-17 DIAGNOSIS — A419 Sepsis, unspecified organism: Secondary | ICD-10-CM | POA: Diagnosis not present

## 2022-07-17 DIAGNOSIS — C3491 Malignant neoplasm of unspecified part of right bronchus or lung: Secondary | ICD-10-CM | POA: Diagnosis not present

## 2022-07-17 DIAGNOSIS — K652 Spontaneous bacterial peritonitis: Secondary | ICD-10-CM | POA: Diagnosis not present

## 2022-07-17 DIAGNOSIS — D61818 Other pancytopenia: Secondary | ICD-10-CM | POA: Diagnosis not present

## 2022-07-17 LAB — PHOSPHORUS: Phosphorus: 2.1 mg/dL — ABNORMAL LOW (ref 2.5–4.6)

## 2022-07-17 LAB — CBC
HCT: 23.1 % — ABNORMAL LOW (ref 39.0–52.0)
Hemoglobin: 7.1 g/dL — ABNORMAL LOW (ref 13.0–17.0)
MCH: 24.1 pg — ABNORMAL LOW (ref 26.0–34.0)
MCHC: 30.7 g/dL (ref 30.0–36.0)
MCV: 78.6 fL — ABNORMAL LOW (ref 80.0–100.0)
Platelets: 146 10*3/uL — ABNORMAL LOW (ref 150–400)
RBC: 2.94 MIL/uL — ABNORMAL LOW (ref 4.22–5.81)
RDW: 15.7 % — ABNORMAL HIGH (ref 11.5–15.5)
WBC: 5.2 10*3/uL (ref 4.0–10.5)
nRBC: 0.4 % — ABNORMAL HIGH (ref 0.0–0.2)

## 2022-07-17 LAB — COMPREHENSIVE METABOLIC PANEL
ALT: 9 U/L (ref 0–44)
AST: 21 U/L (ref 15–41)
Albumin: 2 g/dL — ABNORMAL LOW (ref 3.5–5.0)
Alkaline Phosphatase: 43 U/L (ref 38–126)
Anion gap: 3 — ABNORMAL LOW (ref 5–15)
BUN: 33 mg/dL — ABNORMAL HIGH (ref 8–23)
CO2: 34 mmol/L — ABNORMAL HIGH (ref 22–32)
Calcium: 7.7 mg/dL — ABNORMAL LOW (ref 8.9–10.3)
Chloride: 101 mmol/L (ref 98–111)
Creatinine, Ser: 1.1 mg/dL (ref 0.61–1.24)
GFR, Estimated: >60 mL/min (ref >60)
Glucose, Bld: 107 mg/dL — ABNORMAL HIGH (ref 70–99)
Potassium: 3.7 mmol/L (ref 3.5–5.1)
Sodium: 138 mmol/L (ref 135–145)
Total Bilirubin: 0.6 mg/dL (ref 0.3–1.2)
Total Protein: 5.1 g/dL — ABNORMAL LOW (ref 6.5–8.1)

## 2022-07-17 LAB — PROTIME-INR
INR: 1.2 (ref 0.8–1.2)
Prothrombin Time: 14.7 seconds (ref 11.4–15.2)

## 2022-07-17 LAB — HEMOGLOBIN AND HEMATOCRIT, BLOOD
HCT: 30.1 % — ABNORMAL LOW (ref 39.0–52.0)
Hemoglobin: 9.4 g/dL — ABNORMAL LOW (ref 13.0–17.0)

## 2022-07-17 LAB — PROCALCITONIN: Procalcitonin: 0.12 ng/mL

## 2022-07-17 LAB — MAGNESIUM: Magnesium: 2.5 mg/dL — ABNORMAL HIGH (ref 1.7–2.4)

## 2022-07-17 LAB — ABO/RH: ABO/RH(D): O POS

## 2022-07-17 LAB — PREPARE RBC (CROSSMATCH)

## 2022-07-17 MED ORDER — FOLIC ACID 1 MG PO TABS
1.0000 mg | ORAL_TABLET | Freq: Every day | ORAL | Status: DC
  Administered 2022-07-17 – 2022-07-25 (×9): 1 mg via ORAL
  Filled 2022-07-17 (×9): qty 1

## 2022-07-17 MED ORDER — TAMSULOSIN HCL 0.4 MG PO CAPS
0.4000 mg | ORAL_CAPSULE | Freq: Every day | ORAL | Status: DC
  Administered 2022-07-17 – 2022-07-24 (×7): 0.4 mg via ORAL
  Filled 2022-07-17 (×9): qty 1

## 2022-07-17 MED ORDER — SODIUM CHLORIDE 0.9 % IV SOLN
2.0000 g | INTRAVENOUS | Status: DC
  Administered 2022-07-17 – 2022-07-24 (×8): 2 g via INTRAVENOUS
  Filled 2022-07-17 (×10): qty 20

## 2022-07-17 MED ORDER — POTASSIUM PHOSPHATES 15 MMOLE/5ML IV SOLN
15.0000 mmol | Freq: Once | INTRAVENOUS | Status: AC
  Administered 2022-07-17: 15 mmol via INTRAVENOUS
  Filled 2022-07-17: qty 5

## 2022-07-17 MED ORDER — SODIUM CHLORIDE 0.9% IV SOLUTION: Freq: Once | INTRAVENOUS | Status: AC

## 2022-07-17 NOTE — Progress Notes (Signed)
PROGRESS NOTE  Jeffrey Watson NIO:[id number] DOB: [birth year]   PCP: System, Provider Not In  Patient is from: Home.  Lives with family.  Independently ambulates at baseline  DOA: 07/15/2022 LOS: 2  Chief complaints Chief Complaint  Patient presents with   Shortness of Breath   Nausea   Emesis     Brief Narrative / Interim history: 81 y.o. 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  Cultures obtained.  He was started on broad-spectrum antibiotics.  CT chest, abdomen and pelvis negative for PE but showed distal esophageal wall thickening concerning for esophagitis, emphysema, RLL infiltrate/atelectasis, large ascites, omental nodularity concerning for metastasis and possible small pelvic hemorrhage.  Patient had paracentesis with removal of 2.2 bloody peritoneal fluid.  Blood cultures NGTD.  MRSA PCR negative.  Peritoneal fluid culture NGTD.  Biotic de-escalated to IV ceftriaxone and Flagyl.    Significant drop in Hgb but seems to have reached equilibrium at 7.1.  Transfusing 1 unit  Subjective: Seen and examined earlier this morning.  No major events overnight of this morning.  Continues to endorse abdominal pain and pain in his throat.  He rates his pain 8/10 although he does not appear to be in that much distress.  Patient is not a great historian.  Objective: Vitals:   07/16/22 0500 07/16/22 0601 07/16/22 2026 07/17/22 0514  BP:  114/63 115/61 107/71  Pulse:  91 95 87  Resp:  20 17 19   Temp:  97.9 F (36.6 C) 98.4 F (36.9 C) 98.4 F  (36.9 C)  TempSrc:  Oral Oral Oral  SpO2:  94% 99% 96%  Weight: 46.3 kg     Height:        Examination:  GENERAL: Appears frail.  No apparent distress. HEENT: MMM.  Vision and hearing grossly intact.  NECK: Supple.  No apparent JVD.  RESP:  No IWOB.  Fair aeration bilaterally.  Rhonchorous. CVS:  RRR. Heart sounds normal.  ABD/GI/GU: BS+.  Abdomen full and tender.  Rebound tenderness MSK/EXT:  Moves extremities.  Significant muscle mass and subcu fat loss. SKIN: no apparent skin lesion or wound NEURO: Awake and alert. Oriented fairly.  No apparent focal neuro deficit. PSYCH: Calm. Normal affect.   Procedures:  10/27-paracentesis with removal of 2.2 L  Microbiology summarized: [id number] and influenza PCR nonreactive MRSA PCR screen nonreactive Blood cultures NGTD Peritoneal fluid culture NGTD Respiratory and peritoneal cultures pending   Assessment and plan: Principal Problem:   Severe sepsis with thrombocytopenia (HCC) Active Problems:   BPH (benign prostatic hyperplasia)   Essential hypertension   COPD, mild (HCC)   Acute on chronic respiratory failure with hypoxia and hypercapnia (HCC)   Adenocarcinoma of right lung (HCC)   Centrilobular emphysema (HCC)   Current every day smoker   Non-small cell lung cancer (NSCLC) (HCC)   Pancytopenia (HCC)   Ascites   Esophagitis   SBP (spontaneous bacterial peritonitis) ([place])   Failure to thrive in adult   Aspiration pneumonia (HCC)   AKI (acute kidney injury) ([place])   Acute blood loss anemia   Azotemia  Severe sepsis with thrombocytopenia due to aspiration pneumonia  and SBP: POA.  He has ascites with tenderness and rebound tenderness.  Peritoneal fluid was cloudy and bloody.  Cell count was not performed due to blood clot in peritoneal fluid.  Patient is immunocompromised.  Blood and peritoneal fluid cultures NGTD.  MRSA PCR nonreactive.  Lactic acidosis resolved.  Pro-Cal improved hemodynamically stable.  -Antibiotics  de-escalated to ceftriaxone, Flagyl and Zithromax -Aspiration precaution -SLP eval and treat -Continue Protonix   Stage IV lung cancer/cancer related pain: Followed with [facility]ology team.  On palliative chemo with  Pembrolizumab and Pemetrexed.  Last treatment on 10/19.  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  Seems to have episodic dyspnea.  Currently on room air.  -Continue DuoNebs as needed -Continue LABA/LAMA/ICS-not compliant with this at home. -Encouraged tobacco cessation -Antibiotics as above   Possible malignant ascites/SBP: Likely malignant ascites.S/p para with removal of 2.2 L "bloody" fluid.  Peritoneal fluid with blood clot to calculate cell counts. SAAG -0.6 arguing against PHTN.  His abdomen is still full and tender. -Antibiotics as above -Follow-up fluid cytology   Acute blood loss anemia/possible intra-abdominal bleed: CT showed small volume pelvic hemorrhage.  Peritoneal fluid reportedly bloody.  He also had chemotherapy on 10/19.  Hgb dropped about 3 g but seems to have reachd nadir.  Bilirubin within normal.  He is hemodynamically stable.  Recent Labs    07/04/22 1313 07/15/22 1038 07/15/22 1905 07/16/22 0406 07/16/22 0903 07/16/22 1647 07/16/22 2252 07/17/22 0448  HGB 10.8* 9.6* 9.0* 7.6* 7.4* 7.2* 7.3* 7.1*  -Transfuse 1 unit -SCD for VTE prophylaxis -Monitor H&H.  AKI/azotemia: Baseline Cr 0.9-1.0.  Resolved. Recent Labs    07/04/22 1313 07/15/22 1216 07/15/22 1904 07/16/22 0406 07/17/22 0448  BUN 9 43* 46* 42* 33*  CREATININE 1.03 0.38* 1.61* 1.32* 1.10  -Avoid nephrotoxic meds -Monitor  Pancytopenia: Due to chemotherapy?  Leukopenia resolved.  Thrombocytopenia improving. -Continue monitoring   Dysphagia/possible  esophagitis/distal esophageal wall thickening -Aspiration precaution, SLP eval and PPI   Goal of care counseling: Grim prognosis. -Palliative medicine following.   Hypernatremia: Likely erroneous lab.  Resolved -Recheck BMP   Hyperkalemia-hemolyzed blood.   Failure to thrive/severe malnutrition/generalized weakness Body mass index is 15.99 kg/m. -Palliative medicine consult -Consult dietitian         DVT prophylaxis:  SCDs Start: 07/15/22 1603  Code Status: Full code Family Communication: Updated patient's wife over the phone on 10/28.  None at bedside today Level of care: Progressive Status is: Inpatient Remains inpatient appropriate because: Acute blood loss anemia, severe sepsis, aspiration pneumonia and spontaneous bacterial peritonitis   Final disposition: TBD Consultants:  Palliative medicine  Sch Meds:  Scheduled Meds:  sodium chloride   Intravenous Once   Chlorhexidine Gluconate Cloth  6 each Topical Daily   folic acid  1 mg Oral Daily   nicotine  21 mg Transdermal Daily   tamsulosin  0.4 mg Oral Daily   Continuous Infusions:  ceFEPime (MAXIPIME) IV Stopped (07/16/22 2314)   metronidazole Stopped (07/17/22 0454)   potassium PHOSPHATE IVPB (in mmol) 15 mmol (07/17/22 0939)   PRN Meds:.acetaminophen **OR** acetaminophen, alum & mag hydroxide-simeth, bisacodyl, HYDROmorphone (DILAUDID) injection, ipratropium-albuterol, ondansetron **OR** ondansetron (ZOFRAN) IV, oxyCODONE, polyethylene glycol, sodium chloride flush  Antimicrobials: Anti-infectives (From admission, onward)    Start     Dose/Rate Route Frequency Ordered Stop  07/16/22 1700  vancomycin (VANCOREADY) IVPB 1250 mg/250 mL  Status:  Discontinued        1,250 mg 166.7 mL/hr over 90 Minutes Intravenous Every 24 hours 07/15/22 1528 07/15/22 1918   07/16/22 1000  ceFEPIme (MAXIPIME) 2 g in sodium chloride 0.9 % 100 mL IVPB        2 g 200 mL/hr over 30 Minutes Intravenous Every 12 hours 07/15/22  2000     07/15/22 2100  vancomycin (VANCOREADY) IVPB 1250 mg/250 mL  Status:  Discontinued        1,250 mg 166.7 mL/hr over 90 Minutes Intravenous Every 48 hours 07/15/22 2000 07/16/22 1123   07/15/22 2000  vancomycin (VANCOREADY) IVPB 1250 mg/250 mL  Status:  Discontinued        1,250 mg 166.7 mL/hr over 90 Minutes Intravenous Every 24 hours 07/15/22 1918 07/15/22 2000   07/15/22 1600  metroNIDAZOLE (FLAGYL) IVPB 500 mg        500 mg 100 mL/hr over 60 Minutes Intravenous Every 12 hours 07/15/22 1518     07/15/22 1530  ceFEPIme (MAXIPIME) 2 g in sodium chloride 0.9 % 100 mL IVPB  Status:  Discontinued        2 g 200 mL/hr over 30 Minutes Intravenous Every 8 hours 07/15/22 1522 07/15/22 2000   07/15/22 1530  vancomycin (VANCOREADY) IVPB 1250 mg/250 mL  Status:  Discontinued        1,250 mg 166.7 mL/hr over 90 Minutes Intravenous  Once 07/15/22 1523 07/15/22 1918   07/15/22 1500  cefTRIAXone (ROCEPHIN) 1 g in sodium chloride 0.9 % 100 mL IVPB  Status:  Discontinued        1 g 200 mL/hr over 30 Minutes Intravenous  Once 07/15/22 1452 07/15/22 1522   07/15/22 1500  azithromycin (ZITHROMAX) 500 mg in sodium chloride 0.9 % 250 mL IVPB  Status:  Discontinued        500 mg 250 mL/hr over 60 Minutes Intravenous  Once 07/15/22 1452 07/16/22 1127        I have personally reviewed the following labs and images: CBC: Recent Labs  Lab 07/15/22 1038 07/15/22 1905 07/16/22 0406 07/16/22 0903 07/16/22 1647 07/16/22 2252 07/17/22 0448  WBC 3.4*  --  3.7*  --   --   --  5.2  NEUTROABS 2.0  --   --   --   --   --   --   HGB 9.6*   < > 7.6* 7.4* 7.2* 7.3* 7.1*  HCT 30.2*   < > 24.5* 23.6* 23.2* 23.6* 23.1*  MCV 75.9*  --  76.3*  --   --   --  78.6*  PLT 111*  --  113*  --   --   --  146*   < > = values in this interval not displayed.   BMP &GFR Recent Labs  Lab 07/15/22 1216 07/15/22 1904 07/16/22 0406 07/17/22 0448  NA 154* 138 141 138  K 5.6* 4.0 3.9 3.7  CL 84* 94* 100 101  CO2  27 34* 35* 34*  GLUCOSE 132* 163* 132* 107*  BUN 43* 46* 42* 33*  CREATININE 0.38* 1.61* 1.32* 1.10  CALCIUM 6.8* 8.3* 8.3* 7.7*  MG  --   --   --  2.5*  PHOS  --  4.9*  --  2.1*   Estimated Creatinine Clearance: 35.1 mL/min (by C-G formula based on SCr of 1.1 mg/dL). Liver & Pancreas: Recent Labs  Lab 07/15/22 1904 07/16/22 0406  07/17/22 0448  AST  --  19 21  ALT  --  10 9  ALKPHOS  --  46 43  BILITOT  --  0.7 0.6  PROT  --  5.8* 5.1*  ALBUMIN 2.5* 2.2* 2.0*   No results for input(s): "LIPASE", "AMYLASE" in the last 168 hours. No results for input(s): "AMMONIA" in the last 168 hours. Diabetic: No results for input(s): "HGBA1C" in the last 72 hours. No results for input(s): "GLUCAP" in the last 168 hours. Cardiac Enzymes: No results for input(s): "CKTOTAL", "CKMB", "CKMBINDEX", "TROPONINI" in the last 168 hours. No results for input(s): "PROBNP" in the last 8760 hours. Coagulation Profile: Recent Labs  Lab 07/17/22 0448  INR 1.2   Thyroid Function Tests: No results for input(s): "TSH", "T4TOTAL", "FREET4", "T3FREE", "THYROIDAB" in the last 72 hours. Lipid Profile: No results for input(s): "CHOL", "HDL", "LDLCALC", "TRIG", "CHOLHDL", "LDLDIRECT" in the last 72 hours. Anemia Panel: No results for input(s): "VITAMINB12", "FOLATE", "FERRITIN", "TIBC", "IRON", "RETICCTPCT" in the last 72 hours. Urine analysis:    Component Value Date/Time   COLORURINE YELLOW 07/15/2022 1002   APPEARANCEUR HAZY (A) 07/15/2022 1002   LABSPEC 1.023 07/15/2022 1002   PHURINE 5.0 07/15/2022 1002   GLUCOSEU NEGATIVE 07/15/2022 1002   HGBUR NEGATIVE 07/15/2022 1002   BILIRUBINUR NEGATIVE 07/15/2022 1002   KETONESUR NEGATIVE 07/15/2022 1002   PROTEINUR NEGATIVE 07/15/2022 1002   NITRITE NEGATIVE 07/15/2022 1002   LEUKOCYTESUR NEGATIVE 07/15/2022 1002   Sepsis Labs: Invalid input(s): "PROCALCITONIN", "LACTICIDVEN"  Microbiology: Recent Results (from the past 240 hour(s))  Resp Panel by  RT-PCR (Flu A&B, Covid) Anterior Nasal Swab     Status: None   Collection Time: 07/15/22  9:52 AM   Specimen: Anterior Nasal Swab  Result Value Ref Range Status   SARS Coronavirus 2 by RT PCR NEGATIVE NEGATIVE Final    Comment: (NOTE) SARS-CoV-2 target nucleic acids are NOT DETECTED.  The SARS-CoV-2 RNA is generally detectable in upper respiratory specimens during the acute phase of infection. The lowest concentration of SARS-CoV-2 viral copies this assay can detect is 138 copies/mL. A negative result does not preclude SARS-Cov-2 infection and should not be used as the sole basis for treatment or other patient management decisions. A negative result may occur with  improper specimen collection/handling, submission of specimen other than nasopharyngeal swab, presence of viral mutation(s) within the areas targeted by this assay, and inadequate number of viral copies(<138 copies/mL). A negative result must be combined with clinical observations, patient history, and epidemiological information. The expected result is Negative.  Fact Sheet for Patients:  EntrepreneurPulse.com.au  Fact Sheet for Healthcare Providers:  IncredibleEmployment.be  This test is no t yet approved or cleared by the [place] FDA and  has been authorized for detection and/or diagnosis of SARS-CoV-2 by FDA under an Emergency Use Authorization (EUA). This EUA will remain  in effect (meaning this test can be used) for the duration of the COVID-19 declaration under Section 564(b)(1) of the Act, 21 U.S.C.section 360bbb-3(b)(1), unless the authorization is terminated  or revoked sooner.       Influenza A by PCR NEGATIVE NEGATIVE Final   Influenza B by PCR NEGATIVE NEGATIVE Final    Comment: (NOTE) The Xpert Xpress SARS-CoV-2/FLU/RSV plus assay is intended as an aid in the diagnosis of influenza from Nasopharyngeal swab specimens and should not be used as a sole basis for  treatment. Nasal washings and aspirates are unacceptable for Xpert Xpress SARS-CoV-2/FLU/RSV testing.  Fact Sheet for Patients: EntrepreneurPulse.com.au  Fact Sheet for Healthcare Providers: IncredibleEmployment.be  This test is not yet approved or cleared by the [place] FDA and has been authorized for detection and/or diagnosis of SARS-CoV-2 by FDA under an Emergency Use Authorization (EUA). This EUA will remain in effect (meaning this test can be used) for the duration of the COVID-19 declaration under Section 564(b)(1) of the Act, 21 U.S.C. section 360bbb-3(b)(1), unless the authorization is terminated or revoked.  Performed at [facility], [place] [address] [place], [place] [address]   Gram stain     Status: None   Collection Time: 07/15/22  4:37 PM   Specimen: Fluid  Result Value Ref Range Status   Specimen Description FLUID PERITONEAL  Final   Special Requests NONE  Final   Gram Stain   Final    WBC SEEN NO ORGANISMS SEEN CYTOSPIN SMEAR Gram Stain Report Called to,Read Back By and Verified With: [email or web address],N AT 2138 ON 07/15/22 BY LUZOLOP Performed at [facility], [place] [address], [place], [place] [address]    Report Status 07/15/2022 FINAL  Final  Body fluid culture w Gram Stain     Status: None (Preliminary result)   Collection Time: 07/15/22  4:37 PM   Specimen: Fluid  Result Value Ref Range Status   Specimen Description   Final    FLUID PERITONEAL Performed at [place] [address] [place], [place] [address]    Special Requests   Final    NONE Performed at [facility], [place] [address], [place], [place] [address]    Gram Stain NO WBC SEEN NO ORGANISMS SEEN   Final   Culture   Final    NO GROWTH 1 DAY Performed at [facility], [place] [address], [place], [place] [address]    Report Status PENDING  Incomplete  Culture, blood  (routine x 2)     Status: None (Preliminary result)   Collection Time: 07/15/22  7:04 PM   Specimen: BLOOD  Result Value Ref Range Status   Specimen Description   Final    BLOOD RIGHT ANTECUBITAL Performed at [place] [address] [place], [place] [address]    Special Requests   Final    BOTTLES DRAWN AEROBIC ONLY Blood Culture adequate volume Performed at [place] [address], [place], [place] [address]    Culture   Final    NO GROWTH < 12 HOURS Performed at [place] [address] [place], [place] [address]    Report Status PENDING  Incomplete  Culture, blood (routine x 2)     Status: None (Preliminary result)   Collection Time: 07/15/22  7:06 PM   Specimen: BLOOD LEFT HAND  Result Value Ref Range Status   Specimen Description   Final    BLOOD LEFT HAND Performed at [facility], [place] [address] [place], [place] [address]    Special Requests   Final    BOTTLES DRAWN AEROBIC ONLY Blood Culture adequate volume Performed at [place] [address] [place], [place] [address]    Culture   Final    NO GROWTH < 12 HOURS Performed at [place] [address], [place], [place] [address]    Report Status PENDING  Incomplete  MRSA Next Gen by PCR, Nasal     Status: None   Collection Time: 07/15/22  8:51 PM   Specimen: Nasal Mucosa; Nasal  Swab  Result Value Ref Range Status   MRSA by PCR Next Gen NOT DETECTED NOT DETECTED Final    Comment: (NOTE) The GeneXpert MRSA Assay (FDA approved for NASAL specimens only), is one component of a comprehensive MRSA colonization surveillance program. It is not intended to diagnose MRSA infection nor to guide or monitor treatment for MRSA infections. Test performance is not FDA approved in patients less than 66 years old. Performed at [facility], [place] [address], [place], [place] [address]   Culture, Respiratory w Gram  Stain     Status: None (Preliminary result)   Collection Time: 07/16/22  2:25 AM   Specimen: Expectorated Sputum; Respiratory  Result Value Ref Range Status   Specimen Description   Final    EXPECTORATED SPUTUM Performed at [place] [address], [place], [place] [address]    Special Requests   Final    Immunocompromised Performed at [facility], [place] [address] [place], [place] [address]    Gram Stain   Final    RARE SQUAMOUS EPITHELIAL CELLS PRESENT RARE GRAM VARIABLE ROD RARE GRAM POSITIVE COCCI IN PAIRS    Culture   Final    CULTURE REINCUBATED FOR BETTER GROWTH Performed at [facility], [place] [address], [place], [place] [address]    Report Status PENDING  Incomplete    Radiology Studies: No results found.    Sussan Meter T. L[place]  If 7PM-7AM, please contact night-coverage [email or web address] 07/17/2022, 11:14 AM

## 2022-07-17 NOTE — Progress Notes (Signed)
Mobility Specialist - Progress Note   07/17/22 1127  Oxygen Therapy  SpO2 (!) 78 %  O2 Device Nasal Cannula  O2 Flow Rate (L/min) 4 L/min  Mobility  Activity Ambulated independently in hallway  Level of Assistance Modified independent, requires aide device or extra time  Assistive Device Other (Comment) (IV Pole)  Distance Ambulated (ft) 500 ft  Activity Response Tolerated well  Mobility Referral Yes  $Mobility charge 1 Mobility   Pt agreeable to mobilize, pushed IV pole during session. Pt took 1 standing rest break when O2 dropped to 78%, required ~1 minute to recover to lower 90s. Pt reported 4/10 abdominal pain and requested to return bed at [facility]. Left in bed with all necessities in reach.  Pre-mobility: 96% SpO2 on 4L During mobility: 78% SpO2 on 4L Post-mobility: 97% SPO2 on 4L  Ralene Muskrat Mobility Specialist Acute Rehabilitation Services Phone: [phone] 07/17/22, 11:31 AM

## 2022-07-17 NOTE — Plan of Care (Signed)
  Problem: Health Behavior/Discharge Planning: Goal: Ability to manage health-related needs will improve Outcome: Progressing   

## 2022-07-17 NOTE — Evaluation (Signed)
Occupational Therapy Evaluation Patient Details Name: Jeffrey Watson MRN: [id number] DOB: 12/12/1940 Today's Date: 07/17/2022   History of Present Illness Pt is an 81yo male presenting to [facility] ED on 07/15/22 with complaints of abdominal pain. Pt is s/p R inguinal hernia repair 05/27/22. 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 s/p paracentesis on 10/27 yielding 2.2L. PMH: Stage IV lung cancer on palliative chemotherapy (last 10/19), COPD on 2L, ascites, HTN, tobacco use.   Clinical Impression   Pt is typically independent in ADL and mobility. Today he is supervision (due to hospital setting) for mobility, and min to mod A for LB ADL. He is mod I for standing grooming, transfers, and will benefit from from AE education/demo to maximize safety and independence in LB ADL and to assist with pain management. He will have assist from his wife at home, do not anticipate the need for post-acute OT at this time.      Recommendations for follow up therapy are one component of a multi-disciplinary discharge planning process, led by the attending physician.  Recommendations may be updated based on patient status, additional functional criteria and insurance authorization.   Follow Up Recommendations  No OT follow up    Assistance Recommended at Discharge PRN  Patient can return home with the following A little help with bathing/dressing/bathroom    Functional Status Assessment  Patient has had a recent decline in their functional status and demonstrates the ability to make significant improvements in function in a reasonable and predictable amount of time.  Equipment Recommendations  Other (comment) (potentially AE)    Recommendations for Other Services       Precautions / Restrictions Precautions Precautions: Fall Restrictions Weight Bearing Restrictions: No      Mobility Bed Mobility Overal bed mobility: Modified Independent             General bed mobility comments:  increased time    Transfers Overall transfer level: Modified independent Equipment used: None               General transfer comment: increased time      Balance Overall balance assessment: Mild deficits observed, not formally tested                                         ADL either performed or assessed with clinical judgement   ADL Overall ADL's : Needs assistance/impaired Eating/Feeding: Modified independent   Grooming: Modified independent;Standing;Wash/dry hands;Wash/dry face;Oral care Grooming Details (indicate cue type and reason): sink level Upper Body Bathing: Supervision/ safety   Lower Body Bathing: Minimal assistance;Sit to/from stand Lower Body Bathing Details (indicate cue type and reason): would benefit from AE for LB ADL Upper Body Dressing : Modified independent   Lower Body Dressing: Moderate assistance;Sit to/from stand Lower Body Dressing Details (indicate cue type and reason): would benefit from AE for LB ADL Toilet Transfer: Supervision/safety   Toileting- Clothing Manipulation and Hygiene: Min guard       Functional mobility during ADLs: Supervision/safety (due to hospital setting) General ADL Comments: would benefit from AE for LB ADL     Vision Ability to See in Adequate Light: 0 Adequate Patient Visual Report: No change from baseline       Perception     Praxis      Pertinent Vitals/Pain Pain Assessment Pain Assessment: Faces Faces Pain Scale: Hurts little more Pain Location: abdomen with  bending Pain Descriptors / Indicators: Discomfort, Grimacing Pain Intervention(s): Monitored during session, Repositioned     Hand Dominance     Extremity/Trunk Assessment Upper Extremity Assessment Upper Extremity Assessment: Overall WFL for tasks assessed   Lower Extremity Assessment Lower Extremity Assessment: Defer to PT evaluation   Cervical / Trunk Assessment Cervical / Trunk Assessment: Normal    Communication Communication Communication: HOH   Cognition Arousal/Alertness: Awake/alert Behavior During Therapy: WFL for tasks assessed/performed Overall Cognitive Status: Within Functional Limits for tasks assessed                                       General Comments       Exercises     Shoulder Instructions      Home Living Family/patient expects to be discharged to:: Private residence Living Arrangements: Spouse/significant other Available Help at Discharge: Family;Available 24 hours/day Type of Home: Apartment Home Access: Level entry     Home Layout: One level     Bathroom Shower/Tub: Occupational psychologist: Handicapped height     Home Equipment: None          Prior Functioning/Environment Prior Level of Function : Independent/Modified Independent             Mobility Comments: IND ADLs Comments: IND        OT Problem List: Decreased range of motion;Decreased knowledge of use of DME or AE;Pain      OT Treatment/Interventions: Self-care/ADL training;DME and/or AE instruction;Patient/family education    OT Goals(Current goals can be found in the care plan section) Acute Rehab OT Goals Patient Stated Goal: get home OT Goal Formulation: With patient Time For Goal Achievement: 07/31/22 Potential to Achieve Goals: Good ADL Goals Pt Will Perform Lower Body Bathing: with modified independence;with adaptive equipment;sit to/from stand Pt Will Perform Lower Body Dressing: with modified independence;with adaptive equipment;sit to/from stand  OT Frequency: Min 2X/week    Co-evaluation              AM-PAC OT "6 Clicks" Daily Activity     Outcome Measure Help from another person eating meals?: None Help from another person taking care of personal grooming?: None Help from another person toileting, which includes using toliet, bedpan, or urinal?: None Help from another person bathing (including washing, rinsing,  drying)?: A Little Help from another person to put on and taking off regular upper body clothing?: None Help from another person to put on and taking off regular lower body clothing?: A Little 6 Click Score: 22   End of Session Nurse Communication: Mobility status (ok for independent room mobility)  Activity Tolerance: Patient tolerated treatment well Patient left: in chair;with call bell/phone within reach  OT Visit Diagnosis: Pain;Muscle weakness (generalized) (M62.81) Pain - Right/Left:  (central) Pain - part of body:  (abdomen)                Time: 6378-5885 OT Time Calculation (min): 19 min Charges:  OT General Charges $OT Visit: 1 Visit OT Evaluation $OT Eval Low Complexity: [place] OTR/L Acute Rehabilitation Services Office: [place] 07/17/2022, 6:14 PM

## 2022-07-18 ENCOUNTER — Inpatient Hospital Stay (HOSPITAL_COMMUNITY): Payer: Medicare Other

## 2022-07-18 DIAGNOSIS — A419 Sepsis, unspecified organism: Secondary | ICD-10-CM | POA: Diagnosis not present

## 2022-07-18 DIAGNOSIS — C3491 Malignant neoplasm of unspecified part of right bronchus or lung: Secondary | ICD-10-CM | POA: Diagnosis not present

## 2022-07-18 DIAGNOSIS — K652 Spontaneous bacterial peritonitis: Secondary | ICD-10-CM | POA: Diagnosis not present

## 2022-07-18 DIAGNOSIS — D61818 Other pancytopenia: Secondary | ICD-10-CM | POA: Diagnosis not present

## 2022-07-18 DIAGNOSIS — E43 Unspecified severe protein-calorie malnutrition: Secondary | ICD-10-CM

## 2022-07-18 LAB — RENAL FUNCTION PANEL
Albumin: 2.1 g/dL — ABNORMAL LOW (ref 3.5–5.0)
Anion gap: 4 — ABNORMAL LOW (ref 5–15)
BUN: 19 mg/dL (ref 8–23)
CO2: 33 mmol/L — ABNORMAL HIGH (ref 22–32)
Calcium: 7.7 mg/dL — ABNORMAL LOW (ref 8.9–10.3)
Chloride: 100 mmol/L (ref 98–111)
Creatinine, Ser: 0.89 mg/dL (ref 0.61–1.24)
GFR, Estimated: >60 mL/min (ref >60)
Glucose, Bld: 99 mg/dL (ref 70–99)
Phosphorus: 2.4 mg/dL — ABNORMAL LOW (ref 2.5–4.6)
Potassium: 3.9 mmol/L (ref 3.5–5.1)
Sodium: 137 mmol/L (ref 135–145)

## 2022-07-18 LAB — CULTURE, RESPIRATORY W GRAM STAIN: Culture: NORMAL

## 2022-07-18 LAB — TYPE AND SCREEN
ABO/RH(D): O POS
Antibody Screen: NEGATIVE
Unit division: 0

## 2022-07-18 LAB — CBC
HCT: 27.4 % — ABNORMAL LOW (ref 39.0–52.0)
Hemoglobin: 8.8 g/dL — ABNORMAL LOW (ref 13.0–17.0)
MCH: 26 pg (ref 26.0–34.0)
MCHC: 32.1 g/dL (ref 30.0–36.0)
MCV: 80.8 fL (ref 80.0–100.0)
Platelets: 159 10*3/uL (ref 150–400)
RBC: 3.39 MIL/uL — ABNORMAL LOW (ref 4.22–5.81)
RDW: 17.4 % — ABNORMAL HIGH (ref 11.5–15.5)
WBC: 7.1 10*3/uL (ref 4.0–10.5)
nRBC: 0 % (ref 0.0–0.2)

## 2022-07-18 LAB — MAGNESIUM: Magnesium: 2.1 mg/dL (ref 1.7–2.4)

## 2022-07-18 LAB — HEMOGLOBIN AND HEMATOCRIT, BLOOD
HCT: 29.8 % — ABNORMAL LOW (ref 39.0–52.0)
Hemoglobin: 9.1 g/dL — ABNORMAL LOW (ref 13.0–17.0)

## 2022-07-18 LAB — BPAM RBC
Blood Product Expiration Date: 202311262359
ISSUE DATE / TIME: 202310291504
Unit Type and Rh: 5100

## 2022-07-18 MED ORDER — ENSURE ENLIVE PO LIQD
237.0000 mL | Freq: Three times a day (TID) | ORAL | Status: DC
  Administered 2022-07-18 – 2022-07-25 (×12): 237 mL via ORAL

## 2022-07-18 NOTE — Care Management Important Message (Signed)
Important Message  Patient Details IM Letter given Name: Jeffrey Watson MRN: [id number] Date of Birth: 1941/07/28   Medicare Important Message Given:  Yes     Kerin Salen 07/18/2022, 11:37 AM

## 2022-07-18 NOTE — Progress Notes (Signed)
At bedside for dressing change. Unable to adequately clean needle site due to pt restlessness, ECG leads and gown impeding sterile procedure. Determined an entire dressing/needle change would be safest way for prevention from introduction of pathogens. Hinton Dyer, Agricultural consultant at bedside to assist with pt behavior.

## 2022-07-18 NOTE — Progress Notes (Signed)
Patient's wife updated over the phone. She is not able to visit today.

## 2022-07-18 NOTE — Progress Notes (Signed)
Mobility Specialist Cancellation/Refusal Note:   Pt declined mobility at this time. Pt desta to mid 80s just sitting EOB. Will check back as schedule permits.        [facility]

## 2022-07-18 NOTE — Progress Notes (Signed)
PROGRESS NOTE  Jeffrey Watson WUJ:[id number] DOB: 04/03/1941   PCP: System, Provider Not In  Patient is from: Home.  Lives with family.  Independently ambulates at baseline  DOA: 07/15/2022 LOS: 3  Chief complaints Chief Complaint  Patient presents with   Shortness of Breath   Nausea   Emesis     Brief Narrative / Interim history: 81 y.o. 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  Cultures obtained.  He was started on broad-spectrum antibiotics.  CT chest, abdomen and pelvis negative for PE but showed distal esophageal wall thickening concerning for esophagitis, emphysema, RLL infiltrate/atelectasis, large ascites, omental nodularity concerning for metastasis and possible small pelvic hemorrhage.  Patient had paracentesis with removal of 2.2 bloody peritoneal fluid.  Blood cultures NGTD.  MRSA PCR negative.  Peritoneal fluid culture NGTD.  Antibiotics de-escalated to IV ceftriaxone and Flagyl.  In regards to anemia, hemoglobin reach 19 at 7.1.  Transfused 1 unit with appropriate response. Patient developed increased oxygen requirement the night of 10/29.   Subjective: Seen and examined earlier this morning.  Increased oxygen requirement overnight.  He was on 7 L this morning.  He denies shortness of breath but not a reliable historian.  He wants to leave the hospital today to go to his mother's funeral.  He barely walks.   Objective: Vitals:   07/17/22 2025 07/17/22 2030 07/17/22 2129 07/18/22 0534  BP:   (!) 117/57 121/68  Pulse:   97 91   Resp:   16 19  Temp:   99.2 F (37.3 C) 99.2 F (37.3 C)  TempSrc:   Oral Oral  SpO2: (!) 86% 91% 92% 96%  Weight:      Height:        Examination:  GENERAL: Appears frail.  No apparent distress. HEENT: MMM.  Vision and hearing grossly intact.  NECK: Supple.  No apparent JVD.  RESP:  No IWOB.  Rhonchorous.  Crackles over lung bases. CVS:  RRR. Heart sounds normal.  ABD/GI/GU: BS+. Abd slightly full and tender. MSK/EXT:  Moves extremities.  Significant muscle mass and subcu fat loss. SKIN: no apparent skin lesion or wound NEURO: Awake and alert.  Fairly oriented but very limited insight.  No apparent focal neuro deficit. PSYCH: Calm. Normal affect.     Procedures:  10/27-paracentesis with removal of 2.2 L  Microbiology summarized: NFAOZ-30 and influenza PCR nonreactive MRSA PCR screen nonreactive Blood cultures NGTD Peritoneal fluid culture NGTD Respiratory and peritoneal cultures pending   Assessment and plan: Principal Problem:   Severe sepsis with thrombocytopenia (HCC) Active Problems:   BPH (benign prostatic hyperplasia)   Essential hypertension   COPD, mild (HCC)   Acute on chronic respiratory failure with hypoxia (HCC)   Adenocarcinoma of right lung (HCC)   Centrilobular emphysema (HCC)   Current every day smoker   Non-small cell lung cancer (NSCLC) (HCC)   Pancytopenia (HCC)   Ascites   Esophagitis   SBP (spontaneous bacterial peritonitis) ([place])   Failure to thrive in adult   Aspiration pneumonia ([place])   AKI (acute kidney injury) ([place])   Acute blood loss anemia   Azotemia  Protein-calorie malnutrition, severe  Severe sepsis with thrombocytopenia and hypoxic respiratory failure due to aspiration pneumonia and SBP: POA.  He has ascites with tenderness and rebound tenderness.  Peritoneal fluid was cloudy and bloody.  Cell count was not performed due to blood clot in peritoneal fluid.  Patient is immunocompromised.  Blood and peritoneal fluid cultures  NGTD.  MRSA PCR nonreactive.  Lactic acidosis resolved.  Pro-Cal improved hemodynamically stable.  -Antibiotics de-escalated to ceftriaxone, Flagyl and Zithromax -Aspiration precaution -SLP eval and treat -Continue Protonix   Stage IV lung cancer/cancer related pain: Followed with [facility] oncology team.  On palliative chemo with  Pembrolizumab and Pemetrexed.  Last treatment on 10/19.  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 -Pain control -Outpatient follow-up with oncology -Palliative medicine consulted  Acute on chronic respiratory failure with hypoxia: Now requiring up to 7 L to maintain saturation.  CXR with increased right lung and LLL infiltrate as well as atelectasis.  I suspect going aspiration in the setting of patient's dysphagia.  He is also at risk for PE without chemical VTE prophylaxis due to GI bleed -Continue antibiotics as above -Continue LABA/LAMA/ICS with as needed DuoNeb. -Gave him incentive spirometry and encouraged him to use -Encouraged smoking cessation -Wean oxygen as able -Aspiration precaution -Appreciate help by SLP  Emphysema/chronic COPD: -Management as above.   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  Peritoneal fluid with blood clot to calculate cell counts. SAAG -0.6 arguing against PHTN.  His abdomen is still full and tender. -Antibiotics as above -Follow-up fluid cytology   Acute blood loss anemia/possible intra-abdominal bleed: CT showed small volume pelvic hemorrhage.  Peritoneal fluid reportedly bloody.  He also had chemotherapy on 10/19.  Hgb reachd nadir at 7.1.  Bilirubin within normal.  Infused 1 unit with appropriate response.  He is hemodynamically stable.  Recent Labs    07/04/22 1313 07/15/22 1038 07/15/22 1905 07/16/22 0406 07/16/22 0903 07/16/22 1647 07/16/22 2252 07/17/22 0448 07/17/22 2100 07/18/22 0456  HGB 10.8* 9.6* 9.0* 7.6* 7.4* 7.2* 7.3* 7.1*  9.4* 8.8*  -SCD for VTE prophylaxis -Monitor H&H.  AKI/azotemia: Baseline Cr 0.9-1.0.  Resolved. Recent Labs    07/04/22 1313 07/15/22 1216 07/15/22 1904 07/16/22 0406 07/17/22 0448 07/18/22 0456  BUN 9 43* 46* 42* 33* 19  CREATININE 1.03 0.38* 1.61* 1.32* 1.10 0.89  -Avoid nephrotoxic meds -Monitor  Pancytopenia: Likely due to chemo and cancer.  Leukopenia and thrombocytopenia resolved.  Hgb stable. -Continue monitoring   Dysphagia/possible esophagitis/distal esophageal wall thickening -Aspiration precaution, SLP eval and PPI   Goal of care counseling: Grim prognosis. -Palliative medicine following.   Hypernatremia: Likely erroneous lab.  Resolved   Hyperkalemia-hemolyzed blood.  Resolved.  Grief: recently lost his mother.  Wanted to go to mother's funeral but too weak to get up. -Emotional support   Failure to thrive/severe malnutrition/generalized weakness Body mass index is 15.99 kg/m. -Palliative medicine following -Dietitian consulted. -PT/OT Nutrition Problem: Severe Malnutrition Etiology: chronic illness (lung cancer w/ mets) Signs/Symptoms: severe fat depletion, severe muscle depletion, percent weight loss Percent weight loss: 21.5 % (less than 1 month) Interventions: Ensure Enlive (each supplement provides 350kcal and 20 grams of protein)   DVT prophylaxis:  SCDs Start: 07/15/22 1603  Code Status: Full code Family Communication: Attempted to call patient's wife twice but no answer.  Went to [facility]. Level of care: Progressive Status is: Inpatient Remains inpatient appropriate because: Acute blood loss anemia, severe sepsis, aspiration pneumonia, respiratory  failure and spontaneous bacterial peritonitis   Final disposition: TBD Consultants:  Palliative medicine  Sch Meds:  Scheduled Meds:  Chlorhexidine Gluconate Cloth  6 each Topical Daily   feeding supplement  237 mL Oral TID BM   folic acid  1 mg Oral Daily   nicotine  21 mg Transdermal  Daily   tamsulosin  0.4 mg Oral Daily   Continuous Infusions:  cefTRIAXone (ROCEPHIN)  IV 2 g (07/18/22 1221)   metronidazole Stopped (07/18/22 0436)   PRN Meds:.acetaminophen **OR** acetaminophen, alum & mag hydroxide-simeth, bisacodyl, HYDROmorphone (DILAUDID) injection, ipratropium-albuterol, ondansetron **OR** ondansetron (ZOFRAN) IV, oxyCODONE, polyethylene glycol, sodium chloride flush  Antimicrobials: Anti-infectives (From admission, onward)    Start     Dose/Rate Route Frequency Ordered Stop   07/17/22 1200  cefTRIAXone (ROCEPHIN) 2 g in sodium chloride 0.9 % 100 mL IVPB        2 g 200 mL/hr over 30 Minutes Intravenous Every 24 hours 07/17/22 1115     07/16/22 1700  vancomycin (VANCOREADY) IVPB 1250 mg/250 mL  Status:  Discontinued        1,250 mg 166.7 mL/hr over 90 Minutes Intravenous Every 24 hours 07/15/22 1528 07/15/22 1918   07/16/22 1000  ceFEPIme (MAXIPIME) 2 g in sodium chloride 0.9 % 100 mL IVPB  Status:  Discontinued        2 g 200 mL/hr over 30 Minutes Intravenous Every 12 hours 07/15/22 2000 07/17/22 1115   07/15/22 2100  vancomycin (VANCOREADY) IVPB 1250 mg/250 mL  Status:  Discontinued        1,250 mg 166.7 mL/hr over 90 Minutes Intravenous Every 48 hours 07/15/22 2000 07/16/22 1123   07/15/22 2000  vancomycin (VANCOREADY) IVPB 1250 mg/250 mL  Status:  Discontinued        1,250 mg 166.7 mL/hr over 90 Minutes Intravenous Every 24 hours 07/15/22 1918 07/15/22 2000   07/15/22 1600  metroNIDAZOLE (FLAGYL) IVPB 500 mg        500 mg 100 mL/hr over 60 Minutes Intravenous Every 12 hours 07/15/22 1518     07/15/22 1530  ceFEPIme (MAXIPIME) 2 g in sodium chloride 0.9 % 100 mL IVPB  Status:  Discontinued        2 g 200 mL/hr over 30 Minutes Intravenous Every 8 hours 07/15/22 1522 07/15/22 2000   07/15/22 1530  vancomycin (VANCOREADY) IVPB 1250 mg/250 mL  Status:  Discontinued        1,250 mg 166.7 mL/hr over 90 Minutes Intravenous  Once 07/15/22 1523 07/15/22 1918    07/15/22 1500  cefTRIAXone (ROCEPHIN) 1 g in sodium chloride 0.9 % 100 mL IVPB  Status:  Discontinued        1 g 200 mL/hr over 30 Minutes Intravenous  Once 07/15/22 1452 07/15/22 1522   07/15/22 1500  azithromycin (ZITHROMAX) 500 mg in sodium chloride 0.9 % 250 mL IVPB  Status:  Discontinued        500 mg 250 mL/hr over 60 Minutes Intravenous  Once 07/15/22 1452 07/16/22 1127        I have personally reviewed the following labs and images: CBC: Recent Labs  Lab 07/15/22 1038 07/15/22 1905 07/16/22 0406 07/16/22 0903 07/16/22 1647 07/16/22 2252 07/17/22 0448 07/17/22 2100 07/18/22 0456  WBC 3.4*  --  3.7*  --   --   --  5.2  --  7.1  NEUTROABS 2.0  --   --   --   --   --   --   --   --  HGB 9.6*   < > 7.6*   < > 7.2* 7.3* 7.1* 9.4* 8.8*  HCT 30.2*   < > 24.5*   < > 23.2* 23.6* 23.1* 30.1* 27.4*  MCV 75.9*  --  76.3*  --   --   --  78.6*  --  80.8  PLT 111*  --  113*  --   --   --  146*  --  159   < > = values in this interval not displayed.   BMP &GFR Recent Labs  Lab 07/15/22 1216 07/15/22 1904 07/16/22 0406 07/17/22 0448 07/18/22 0456  NA 154* 138 141 138 137  K 5.6* 4.0 3.9 3.7 3.9  CL 84* 94* 100 101 100  CO2 27 34* 35* 34* 33*  GLUCOSE 132* 163* 132* 107* 99  BUN 43* 46* 42* 33* 19  CREATININE 0.38* 1.61* 1.32* 1.10 0.89  CALCIUM 6.8* 8.3* 8.3* 7.7* 7.7*  MG  --   --   --  2.5* 2.1  PHOS  --  4.9*  --  2.1* 2.4*   Estimated Creatinine Clearance: 43.4 mL/min (by C-G formula based on SCr of 0.89 mg/dL). Liver & Pancreas: Recent Labs  Lab 07/15/22 1904 07/16/22 0406 07/17/22 0448 07/18/22 0456  AST  --  19 21  --   ALT  --  10 9  --   ALKPHOS  --  46 43  --   BILITOT  --  0.7 0.6  --   PROT  --  5.8* 5.1*  --   ALBUMIN 2.5* 2.2* 2.0* 2.1*   No results for input(s): "LIPASE", "AMYLASE" in the last 168 hours. No results for input(s): "AMMONIA" in the last 168 hours. Diabetic: No results for input(s): "HGBA1C" in the last 72 hours. No results for  input(s): "GLUCAP" in the last 168 hours. Cardiac Enzymes: No results for input(s): "CKTOTAL", "CKMB", "CKMBINDEX", "TROPONINI" in the last 168 hours. No results for input(s): "PROBNP" in the last 8760 hours. Coagulation Profile: Recent Labs  Lab 07/17/22 0448  INR 1.2   Thyroid Function Tests: No results for input(s): "TSH", "T4TOTAL", "FREET4", "T3FREE", "THYROIDAB" in the last 72 hours. Lipid Profile: No results for input(s): "CHOL", "HDL", "LDLCALC", "TRIG", "CHOLHDL", "LDLDIRECT" in the last 72 hours. Anemia Panel: No results for input(s): "VITAMINB12", "FOLATE", "FERRITIN", "TIBC", "IRON", "RETICCTPCT" in the last 72 hours. Urine analysis:    Component Value Date/Time   COLORURINE YELLOW 07/15/2022 1002   APPEARANCEUR HAZY (A) 07/15/2022 1002   LABSPEC 1.023 07/15/2022 1002   PHURINE 5.0 07/15/2022 1002   GLUCOSEU NEGATIVE 07/15/2022 1002   HGBUR NEGATIVE 07/15/2022 1002   BILIRUBINUR NEGATIVE 07/15/2022 1002   KETONESUR NEGATIVE 07/15/2022 1002   PROTEINUR NEGATIVE 07/15/2022 1002   NITRITE NEGATIVE 07/15/2022 1002   LEUKOCYTESUR NEGATIVE 07/15/2022 1002   Sepsis Labs: Invalid input(s): "PROCALCITONIN", "LACTICIDVEN"  Microbiology: Recent Results (from the past 240 hour(s))  Resp Panel by RT-PCR (Flu A&B, Covid) Anterior Nasal Swab     Status: None   Collection Time: 07/15/22  9:52 AM   Specimen: Anterior Nasal Swab  Result Value Ref Range Status   SARS Coronavirus 2 by RT PCR NEGATIVE NEGATIVE Final    Comment: (NOTE) SARS-CoV-2 target nucleic acids are NOT DETECTED.  The SARS-CoV-2 RNA is generally detectable in upper respiratory specimens during the acute phase of infection. The lowest concentration of SARS-CoV-2 viral copies this assay can detect is 138 copies/mL. A negative result does not preclude SARS-Cov-2 infection and should not be  used as the sole basis for treatment or other patient management decisions. A negative result may occur with  improper  specimen collection/handling, submission of specimen other than nasopharyngeal swab, presence of viral mutation(s) within the areas targeted by this assay, and inadequate number of viral copies(<138 copies/mL). A negative result must be combined with clinical observations, patient history, and epidemiological information. The expected result is Negative.  Fact Sheet for Patients:  EntrepreneurPulse.com.au  Fact Sheet for Healthcare Providers:  IncredibleEmployment.be  This test is no t yet approved or cleared by the [place] FDA and  has been authorized for detection and/or diagnosis of SARS-CoV-2 by FDA under an Emergency Use Authorization (EUA). This EUA will remain  in effect (meaning this test can be used) for the duration of the COVID-19 declaration under Section 564(b)(1) of the Act, 21 U.S.C.section 360bbb-3(b)(1), unless the authorization is terminated  or revoked sooner.       Influenza A by PCR NEGATIVE NEGATIVE Final   Influenza B by PCR NEGATIVE NEGATIVE Final    Comment: (NOTE) The Xpert Xpress SARS-CoV-2/FLU/RSV plus assay is intended as an aid in the diagnosis of influenza from Nasopharyngeal swab specimens and should not be used as a sole basis for treatment. Nasal washings and aspirates are unacceptable for Xpert Xpress SARS-CoV-2/FLU/RSV testing.  Fact Sheet for Patients: EntrepreneurPulse.com.au  Fact Sheet for Healthcare Providers: IncredibleEmployment.be  This test is not yet approved or cleared by the [place] FDA and has been authorized for detection and/or diagnosis of SARS-CoV-2 by FDA under an Emergency Use Authorization (EUA). This EUA will remain in effect (meaning this test can be used) for the duration of the COVID-19 declaration under Section 564(b)(1) of the Act, 21 U.S.C. section 360bbb-3(b)(1), unless the authorization is terminated or revoked.  Performed at  [facility], [place] [address], [place], [place] [address]   Gram stain     Status: None   Collection Time: 07/15/22  4:37 PM   Specimen: Fluid  Result Value Ref Range Status   Specimen Description FLUID PERITONEAL  Final   Special Requests NONE  Final   Gram Stain   Final    WBC SEEN NO ORGANISMS SEEN CYTOSPIN SMEAR Gram Stain Report Called to,Read Back By and Verified With: [email or web address],O AT 2138 ON 07/15/22 BY LUZOLOP Performed at [facility], [place] [address], [place], [place] [address]    Report Status 07/15/2022 FINAL  Final  Body fluid culture w Gram Stain     Status: None (Preliminary result)   Collection Time: 07/15/22  4:37 PM   Specimen: Fluid  Result Value Ref Range Status   Specimen Description   Final    FLUID PERITONEAL Performed at [place] [address], [place], [place] [address]    Special Requests   Final    NONE Performed at [facility], [place] [address] [place], [place] [address]    Gram Stain NO WBC SEEN NO ORGANISMS SEEN   Final   Culture   Final    NO GROWTH 2 DAYS Performed at [facility], [place] [address] [place], [place] [address]    Report Status PENDING  Incomplete  Culture, blood (routine x 2)     Status: None (Preliminary result)   Collection Time: 07/15/22  7:04 PM   Specimen: BLOOD  Result Value Ref Range Status   Specimen Description   Final    BLOOD RIGHT ANTECUBITAL Performed at [facility]  [place] [address] [place], [place] [address]    Special Requests   Final    BOTTLES DRAWN AEROBIC ONLY Blood Culture adequate volume Performed at [place] [address], [place], [place] [address]    Culture   Final    NO GROWTH 3 DAYS Performed at [facility], [place] [address], [place], [place] [address]    Report Status PENDING  Incomplete  Culture, blood (routine x 2)     Status: None (Preliminary result)    Collection Time: 07/15/22  7:06 PM   Specimen: BLOOD LEFT HAND  Result Value Ref Range Status   Specimen Description   Final    BLOOD LEFT HAND Performed at [facility], [place] [address], [place], [place] [address]    Special Requests   Final    BOTTLES DRAWN AEROBIC ONLY Blood Culture adequate volume Performed at [place] [address] [place], [place] [address]    Culture   Final    NO GROWTH 3 DAYS Performed at [facility], [place] [address] [place], [place] [address]    Report Status PENDING  Incomplete  MRSA Next Gen by PCR, Nasal     Status: None   Collection Time: 07/15/22  8:51 PM   Specimen: Nasal Mucosa; Nasal Swab  Result Value Ref Range Status   MRSA by PCR Next Gen NOT DETECTED NOT DETECTED Final    Comment: (NOTE) The GeneXpert MRSA Assay (FDA approved for NASAL specimens only), is one component of a comprehensive MRSA colonization surveillance program. It is not intended to diagnose MRSA infection nor to guide or monitor treatment for MRSA infections. Test performance is not FDA approved in patients less than 13 years old. Performed at [facility], [place] [address] [place], [place] [address]   Culture, Respiratory w Gram Stain     Status: None   Collection Time: 07/16/22  2:25 AM   Specimen: Expectorated Sputum; Respiratory  Result Value Ref Range Status   Specimen Description   Final    EXPECTORATED SPUTUM Performed at [place] [address], [place], [place] [address]    Special Requests   Final    Immunocompromised Performed at [facility], [place] [address], [place], [place] [address]    Gram Stain   Final    RARE SQUAMOUS EPITHELIAL CELLS PRESENT RARE GRAM VARIABLE ROD RARE GRAM POSITIVE COCCI IN PAIRS    Culture   Final    FEW Normal respiratory flora-no Staph aureus or Pseudomonas seen Performed at [facility], [address] [place],  [place] [address]    Report Status 07/18/2022 FINAL  Final    Radiology Studies: DG Chest 2 View  Result Date: 07/18/2022 CLINICAL DATA:  Hypoxemia EXAM: CHEST - 2 VIEW COMPARISON:  07/15/2022 FINDINGS: RIGHT jugular Port-A-Cath with tip projecting over SVC. Normal heart size, mediastinal contours, and pulmonary vascularity. Emphysematous changes with bullous disease RIGHT apex. Increased RIGHT lung infiltrates as well as increased atelectasis versus consolidation LEFT lower lobe. Small bibasilar effusions. No pneumothorax or acute osseous findings. IMPRESSION: Increased RIGHT lung infiltrates as well as atelectasis versus consolidation LEFT lower lobe. Small bibasilar effusions, greater on RIGHT. Underlying emphysematous changes. Emphysema (ICD10-J43.9). Electronically Signed   By: Lavonia Dana M.D.   On: 07/18/2022 10:28      Saleema Weppler T. [place]  If 7PM-7AM, please contact night-coverage [email or web address] 07/18/2022, 1:06 PM

## 2022-07-18 NOTE — Progress Notes (Signed)
Chaplain attempted two visits today with Jeffrey Watson but on both occassions he was with a physician or specialist.  Chaplain will follow-up later today.     07/18/22 1300  Clinical Encounter Type  Visited With Patient not available  Visit Type Initial

## 2022-07-18 NOTE — Progress Notes (Signed)
Speech Language Pathology Treatment: Dysphagia  Patient Details Name: Jeffrey Watson MRN: [id number] DOB: 13-Mar-1941 Today's Date: 07/18/2022 Time: 9379-0240 SLP Time Calculation (min) (ACUTE ONLY): 20 min  Assessment / Plan / Recommendation Clinical Impression  Patient seen by SLP for skilled treatment session focused on dysphagia goals. Patient awake and alert, has difficulty fully expressing himself. Patient told SLP that he has had dysphagia symptoms for past 6-12 months. SLP observed patient with PO intake of sips of thin liquids (juice).No overt s/s aspiration or penetration but SLP suspects swallow initiation delay. Approximately 10-15 seconds after last sip, patient suddenly starts acting as though he is going to regurgitate and he grabbed his emesis bag. No regurgitation occurred and patient did not exhibit any further instances of this. He did present with persistent hiccuping which was fairly silent. Based on patient's presentation, SLP suspects swallow disorder is primarily esophageal but cannot r/o pharyngeal component. SLP recommending to continue with current diet and will proceed with MBS likely next 1-2 dates.    HPI HPI: Jeffrey Watson is a 81 y.o. male with PMH of stage IV lung cancer on palliative chemotherapy with pemetrexed and pembrolizumab maintenance followed with [facility] in [facility], COPD/emphysema, chronic hypoxic RF on 2 L intermittently, ascites, hypertension, tobacco use disorder and central right inguinal hernia repair presenting with abdominal pain; Patient was seen in ED on 10/16.  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  Patient was discharged from ED to follow-up with his oncologist. Patient had his palliative chemotherapy  on 10/19.  Per EDP, patient presented with shortness of breath, nausea, vomiting and hypoxia to low 80s on room air for which he was put on 4 L by EMS brought to ED; ST consulted to assess swallowing function via BSE.      SLP Plan  Continue with current plan of care;MBS      Recommendations for follow up therapy are one component of a multi-disciplinary discharge planning process, led by the attending physician.  Recommendations may be updated based on patient status, additional functional criteria and insurance authorization.    Recommendations  Diet recommendations: Thin liquid Liquids provided via: Cup;Straw Medication Administration: Other (Comment) (as tolerated, one at a time) Supervision: Patient able to self feed Postural Changes and/or Swallow Maneuvers: Seated upright 90 degrees;Upright 30-60 min after meal                Oral Care Recommendations: Oral care BID;Patient independent with oral care Follow Up Recommendations: Follow physician's recommendations for discharge plan and follow up therapies Assistance recommended at discharge: Intermittent Supervision/Assistance SLP Visit Diagnosis: Dysphagia, unspecified (R13.10) Plan: Continue with current plan of care;MBS          Sonia Baller, MA, CCC-SLP Speech Therapy

## 2022-07-18 NOTE — Progress Notes (Signed)
Initial Nutrition Assessment  DOCUMENTATION CODES:  Severe malnutrition in context of chronic illness  INTERVENTION:  -Provide diet per SLP recommendations -Provide Ensure Plus HP TID (350kcal, 20g protein/bottle) to meet 62%kcal needs and 85% protein needs  NUTRITION DIAGNOSIS:  Severe Malnutrition related to chronic illness (lung cancer w/ mets) as evidenced by severe fat depletion, severe muscle depletion, percent weight loss.  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 Admitted with concern for severe sepsis with thrombocytopenia in the setting of aspiration pneumonia and possible SBP.  Visited pt at bedside this morning. He reports a good appetite but has continued to lose weight in the last several months. Chart review shows a significant 21% unintended weight loss in less than 1 month. NFPE shows severe muscle wasting and severe fat loss. Pt meets ASPEN criteria for severe protein calorie malnutrition r/t chronic illness.   Before RD started exam pt said, "I'd be surprised if you can find any muscles." He reports drinking Boost or Ensure supplements at home and is agreeable to ONS during admission. Recommend Ensure Plus HP TID to provide 350kcal and 20g protein/bottle. Threee supplements/day will provide 62% calorie needs and 85% protein needs. Being followed by SLP and recommended full liquid diet only. If diet cannot be further advanced consider nutrition support.  Medications reviewed and include: folic acid, nicoderm cq, prn oxycodone, prn miralax/glycolax  Labs reviewed: Corrected Ca:8.8 (Ca:7.7,  albumin:2.1), Phos:2.4  Weight History: 07/16/22 46.3 kg  07/04/22 58.96 kg  03/05/18 75.8 kg  Significant 21% unintended weight loss in <1 month  NUTRITION - FOCUSED PHYSICAL EXAM:  Flowsheet Row Most Recent Value  Orbital Region Severe depletion  Upper Arm Region Severe depletion  Thoracic and Lumbar Region Severe depletion  Buccal Region Severe depletion  Temple Region Severe depletion  Clavicle Bone Region Severe depletion  Clavicle and Acromion Bone Region Severe depletion  Scapular Bone Region Unable to assess  Dorsal Hand Severe depletion  Patellar Region Severe depletion  Anterior Thigh Region Severe depletion  Posterior Calf Region Moderate depletion  Hair Reviewed  Eyes Reviewed  Mouth Reviewed  Skin Reviewed  Nails Reviewed       Diet Order:   Diet Order             Diet full liquid Room service appropriate? Yes; Fluid consistency: Thin  Diet effective now                   EDUCATION NEEDS:  Education needs have been addressed  Skin:  Skin Assessment: Reviewed RN Assessment  Last BM:  unknown, PTA  Height:  Ht Readings from Last 1 Encounters:  07/16/22 5\' 7"  (1.702 m)   Weight:  Wt Readings from Last 1 Encounters:  07/16/22 46.3 kg    Ideal Body Weight:  67.3 kg  BMI:  Body mass index is 15.99 kg/m.  Estimated Nutritional Needs:  Kcal:  1700-2050kcal ([place] x 1.5-1.8, REE=1138) Protein:  70-95g (1.5-2.0g/kg) Fluid:  >1458mL  Candise Bowens, MS, RD, LDN, CNSC See AMi[facility] for contact information

## 2022-07-18 NOTE — Progress Notes (Signed)
Mobility Specialist Cancellation/Refusal Note:  Pt declined mobility at this time. Pt O2 stats at 87% just laying down. Nurse notified & suggested I come back after his chest xray this evening. Pt left in room w/ call bell in reach. Will check back as schedule permits.       [facility]

## 2022-07-19 DIAGNOSIS — K652 Spontaneous bacterial peritonitis: Secondary | ICD-10-CM | POA: Diagnosis not present

## 2022-07-19 DIAGNOSIS — D61818 Other pancytopenia: Secondary | ICD-10-CM | POA: Diagnosis not present

## 2022-07-19 DIAGNOSIS — C3491 Malignant neoplasm of unspecified part of right bronchus or lung: Secondary | ICD-10-CM | POA: Diagnosis not present

## 2022-07-19 DIAGNOSIS — A419 Sepsis, unspecified organism: Secondary | ICD-10-CM | POA: Diagnosis not present

## 2022-07-19 LAB — CBC
HCT: 29 % — ABNORMAL LOW (ref 39.0–52.0)
Hemoglobin: 9.1 g/dL — ABNORMAL LOW (ref 13.0–17.0)
MCH: 25.6 pg — ABNORMAL LOW (ref 26.0–34.0)
MCHC: 31.4 g/dL (ref 30.0–36.0)
MCV: 81.5 fL (ref 80.0–100.0)
Platelets: 163 10*3/uL (ref 150–400)
RBC: 3.56 MIL/uL — ABNORMAL LOW (ref 4.22–5.81)
RDW: 18.3 % — ABNORMAL HIGH (ref 11.5–15.5)
WBC: 9.6 10*3/uL (ref 4.0–10.5)
nRBC: 0 % (ref 0.0–0.2)

## 2022-07-19 LAB — RENAL FUNCTION PANEL
Albumin: 1.9 g/dL — ABNORMAL LOW (ref 3.5–5.0)
Anion gap: 7 (ref 5–15)
BUN: 16 mg/dL (ref 8–23)
CO2: 32 mmol/L (ref 22–32)
Calcium: 7.7 mg/dL — ABNORMAL LOW (ref 8.9–10.3)
Chloride: 99 mmol/L (ref 98–111)
Creatinine, Ser: 0.81 mg/dL (ref 0.61–1.24)
GFR, Estimated: >60 mL/min (ref >60)
Glucose, Bld: 136 mg/dL — ABNORMAL HIGH (ref 70–99)
Phosphorus: 1.7 mg/dL — ABNORMAL LOW (ref 2.5–4.6)
Potassium: 3.7 mmol/L (ref 3.5–5.1)
Sodium: 138 mmol/L (ref 135–145)

## 2022-07-19 LAB — MAGNESIUM: Magnesium: 2 mg/dL (ref 1.7–2.4)

## 2022-07-19 LAB — BODY FLUID CULTURE W GRAM STAIN
Culture: NO GROWTH
Gram Stain: NONE SEEN

## 2022-07-19 MED ORDER — ORAL CARE MOUTH RINSE: 15.0000 mL | OROMUCOSAL | Status: DC | PRN

## 2022-07-19 MED ORDER — POTASSIUM PHOSPHATES 15 MMOLE/5ML IV SOLN
30.0000 mmol | Freq: Once | INTRAVENOUS | Status: AC
  Administered 2022-07-19: 30 mmol via INTRAVENOUS
  Filled 2022-07-19: qty 10

## 2022-07-19 NOTE — Progress Notes (Signed)
Speech Language Pathology Treatment: Dysphagia  Patient Details Name: Jeffrey Watson MRN: [id number] DOB: 1941-05-14 Today's Date: 07/19/2022 Time: 4097-3532 SLP Time Calculation (min) (ACUTE ONLY): 25 min  Assessment / Plan / Recommendation Clinical Impression  ST consulted to assess swallowing function via BSE. Pt reports poor intake for weeks reporting sensation of food/drink wanting to come back up.  Liquids = cranberry juice tolerated better than solids per pt. He also endorses ongoing issues with pills sensing they want to come up. Reports he consumed an entire Ensure today - advised per RD he needs 3 Ensures daily.    Pt appears somewhat disoriented as he is reporting he sawa him mother pass away in front of him 3 days ago.  Offered comfort to the pt.    Observed pt consuming cranberry juice via straw after SLP and NT repositioned him. Swallow clinically judged to be timely with no indication of retention or discomfort.   Pt reports the cranberry juice is the "smoothest" to consume.  Recommend continue diet as pt tolerates.     RN reports pt has not had a BM since admitted, he reports not having one for several days prior to admit - and advises that he is not consuming any solids.  Bowel sounds hypoactive per RN- thus would recommend defer any studies involving barium at this time.    Note pt's CT of chest showed likely esophagitis evidenced by "Moderate to severe diffuse esophageal wall Thickening".  Pt denies swallowing issues improving or worsening. Question if pt may benefit from further medical management of his esophagitis - ? PPI, ? Carafate, ? Magic mouthwash?  Messaged MD with questions.   Will follow up briefly to determine indication for MBS and to provide strategies to compensate for suspected esophagitis.  Pt already advising he needs to sit upright fully for po - advised also several small meals daily.     HPI HPI: Jeffrey Watson is a 81 y.o. male with PMH of stage IV lung cancer on  palliative chemotherapy with pemetrexed and pembrolizumab maintenance followed with [facility] onc[facility] group in [facility], COPD/emphysema, chronic hypoxic RF on 2 L intermittently, ascites, hypertension, tobacco use disorder and central right inguinal hernia repair presenting with abdominal pain; Patient was seen in ED on 10/16.  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  Patient was discharged from ED to follow-up with his oncologist. Patient had his palliative chemotherapy on 10/19.  Per EDP, patient presented with shortness of breath, nausea, vomiting and hypoxia to low 80s on room air for which he was put on 4 L by EMS brought to ED; ST consulted to assess swallowing function via BSE.  Pt reports poor intake for weeks reporting sensation of food/drink wanting to come back up - liquids = cranberry juice tolerated better than solids per pt.  He also endorses ongoing issues with pills sensing they want to come up.      SLP Plan  Continue with current plan of care;MBS      Recommendations for follow up therapy are one component of a multi-disciplinary discharge planning process, led by the attending physician.  Recommendations may be updated based on patient status, additional functional criteria and insurance authorization.    Recommendations  Liquids provided via: Cup;Straw Medication Administration:  (with liquids)  Supervision: Patient able to self feed Compensations: Slow rate;Small sips/bites;Other (Comment) (fully upright for po please) Postural Changes and/or Swallow Maneuvers: Seated upright 90 degrees;Upright 30-60 min after meal                Oral Care Recommendations: Oral care BID;Patient independent with oral care Follow Up Recommendations:  Follow physician's recommendations for discharge plan and follow up therapies Assistance recommended at discharge: Intermittent Supervision/Assistance SLP Visit Diagnosis: Dysphagia, unspecified (R13.10) Plan: Continue with current plan of care;MBS          Kathleen Lime, MS [place] Office [phone] Pager [phone]  Macario Golds  07/19/2022, 10:20 AM

## 2022-07-19 NOTE — Progress Notes (Signed)
PROGRESS NOTE  Kinston Magnan BOF:[id number] DOB: [birth year]   PCP: System, Provider Not In  Patient is from: Home.  Lives with family.  Independently ambulates at baseline  DOA: 07/15/2022 LOS: 4  Chief complaints Chief Complaint  Patient presents with   Shortness of Breath   Nausea   Emesis     Brief Narrative / Interim history: 81 y.o. 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  Cultures obtained.  He was started on broad-spectrum antibiotics.  CT chest, abdomen and pelvis negative for PE but showed distal esophageal wall thickening concerning for esophagitis, emphysema, RLL infiltrate/atelectasis, large ascites, omental nodularity concerning for metastasis and possible small pelvic hemorrhage.  Patient had paracentesis with removal of 2.2 bloody peritoneal fluid.  Blood cultures NGTD.  MRSA PCR negative.  Peritoneal fluid culture NGTD.  Antibiotics de-escalated to IV ceftriaxone and Flagyl.  In regards to anemia, hemoglobin reach 19 at 7.1.  Transfused 1 unit with appropriate response.  H&H stable.  Patient developed increased oxygen requirement the night of 10/29.  CXR with worsening right lung infiltrate concerning for aspiration.  Subjective: Seen and examined earlier this morning.  No major events overnight of this morning.  No complaints but not a great historian.  Has been on 6 L.  Oxygen dropped to 89% on 4 L.  Objective: Vitals:   07/18/22 0534 07/18/22 1343 07/19/22 0532 07/19/22 0532  BP: 121/68 (!) 106/59 122/71 122/71  Pulse:  91 (!) 102 93 93  Resp: 19 20 16 16   Temp: 99.2 F (37.3 C) 98.1 F (36.7 C) 99.2 F (37.3 C) 99.2 F (37.3 C)  TempSrc: Oral Oral Oral Oral  SpO2: 96% 98% 97% 97%  Weight:      Height:        Examination:  GENERAL: Appears frail.  No distress. HEENT: MMM.  Vision and hearing grossly intact.  NECK: Supple.  No apparent JVD.  RESP:  No IWOB.  Crackles and diminished lung sounds over RLL CVS:  RRR. Heart sounds normal.  ABD/GI/GU: BS+. Abd full but soft.  Tender to palpation. MSK/EXT:  Moves extremities.  Significant muscle mass and subcu fat loss. SKIN: no apparent skin lesion or wound NEURO: Awake and alert. Oriented appropriately.  No apparent focal neuro deficit. PSYCH: Calm. Normal affect.     Procedures:  10/27-paracentesis with removal of 2.2 L  Microbiology summarized: [id number] and influenza PCR nonreactive MRSA PCR screen nonreactive Blood cultures NGTD Peritoneal fluid culture NGTD Respiratory and peritoneal cultures pending   Assessment and plan: Principal Problem:   Severe sepsis with thrombocytopenia (HCC) Active Problems:   BPH (benign prostatic hyperplasia)   Essential hypertension   COPD, mild (HCC)   Acute on chronic respiratory failure with hypoxia (HCC)   Adenocarcinoma of right lung (HCC)   Centrilobular emphysema (HCC)   Current every day smoker   Non-small cell lung cancer (NSCLC) (HCC)   Pancytopenia (HCC)   Ascites   Esophagitis   SBP (spontaneous bacterial peritonitis) ([place])   Failure to thrive in adult   Aspiration pneumonia ([place])   AKI (acute kidney injury) ([place])   Acute blood loss  anemia   Azotemia   Protein-calorie malnutrition, severe  Severe sepsis with thrombocytopenia and hypoxic respiratory failure due to aspiration pneumonia and SBP: POA.  He has ascites with tenderness and rebound tenderness.  Peritoneal fluid was cloudy and bloody.  Cell count was not performed due to blood clot in peritoneal fluid.  Patient is  immunocompromised.  Blood and peritoneal fluid cultures NGTD.  MRSA PCR nonreactive.  Lactic acidosis resolved.  Pro-Cal improved.  Hemodynamically stable.  -Antibiotics de-escalated to ceftriaxone, Flagyl and Zithromax -Aspiration precaution-HOB 45 degrees -SLP eval and treat -Continue Protonix   Stage IV lung cancer/cancer related pain: Followed with [facility] oncology team.  On palliative chemo with  Pembrolizumab and Pemetrexed.  Last treatment on 10/19.  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 -Pain control -Outpatient follow-up with oncology -Palliative medicine consulted  Acute on chronic respiratory failure with hypoxia: Now requiring up to 7 L to maintain saturation.  CXR with increased right lung and LLL infiltrate as well as atelectasis.  I suspect going aspiration in the setting of patient's dysphagia.  He is also at risk for PE without chemical VTE prophylaxis due to GI bleed -Continue antibiotics as above -Continue LABA/LAMA/ICS with as needed DuoNeb. -Gave him incentive spirometry and encouraged him to use -Encouraged smoking cessation -Wean oxygen as able -Aspiration precaution -Appreciate help by SLP  Emphysema/chronic COPD: -Management as above.   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  Peritoneal fluid with blood clot to calculate cell counts. SAAG -0.6 arguing against PHTN.  His abdomen is still full and tender. -Antibiotics as above -Follow-up fluid cytology   Acute blood loss anemia/possible intra-abdominal bleed: CT showed small volume pelvic hemorrhage.  Peritoneal fluid reportedly bloody.  He also had chemotherapy on 10/19.  Hgb reachd nadir at 7.1.  Bilirubin within normal.  Infused 1 unit with appropriate response.  He is hemodynamically stable.  Recent Labs    07/15/22 1905 07/16/22 0406 07/16/22 0903 07/16/22 1647 07/16/22 2252 07/17/22 0448 07/17/22 2100 07/18/22 0456  07/18/22 1700 07/19/22 0313  HGB 9.0* 7.6* 7.4* 7.2* 7.3* 7.1* 9.4* 8.8* 9.1* 9.1*  -SCD for VTE prophylaxis -Monitor H&H.  AKI/azotemia: Baseline Cr 0.9-1.0.  Resolved. Recent Labs    07/04/22 1313 07/15/22 1216 07/15/22 1904 07/16/22 0406 07/17/22 0448 07/18/22 0456 07/19/22 0313  BUN 9 43* 46* 42* 33* 19 16  CREATININE 1.03 0.38* 1.61* 1.32* 1.10 0.89 0.81  -Avoid nephrotoxic meds -Monitor  Pancytopenia: due to chemo and cancer.  Leukopenia and thrombocytopenia resolved.  Hgb improved. -Continue monitoring   Dysphagia/possible esophagitis/distal esophageal wall thickening -Aspiration precaution, SLP eval and PPI   Goal of care counseling: Grim prognosis. -Palliative medicine following.   Hypernatremia: Likely erroneous lab.  Resolved   Hypophosphatemia: -Monitor replenish as appropriate  Grief: recently lost his mother.  Wanted to go to mother's funeral but too weak to get up. -Emotional support   Failure to thrive/severe malnutrition/generalized weakness Body mass index is 15.99 kg/m. -Palliative medicine following -Dietitian consulted. -PT/OT Nutrition Problem: Severe Malnutrition Etiology: chronic illness (lung cancer w/ mets) Signs/Symptoms: severe fat depletion, severe muscle depletion, percent weight loss Percent weight loss: 21.5 % (less than 1 month) Interventions: Ensure Enlive (each supplement provides 350kcal and 20 grams of protein)   DVT prophylaxis:  SCDs Start: 07/15/22 1603  Code Status: Full code Family Communication: Updated patient's wife over the phone on 10/30. Level of care: Progressive Status is: Inpatient Remains inpatient appropriate because: Acute  blood loss anemia, severe sepsis, aspiration pneumonia, respiratory failure and spontaneous bacterial peritonitis   Final disposition: TBD Consultants:  Palliative medicine  Sch Meds:  Scheduled Meds:  Chlorhexidine Gluconate Cloth  6 each Topical Daily   feeding supplement   237 mL Oral TID BM   folic acid  1 mg Oral Daily   nicotine  21 mg Transdermal Daily   tamsulosin  0.4 mg Oral Daily   Continuous Infusions:  cefTRIAXone (ROCEPHIN)  IV 2 g (07/18/22 1221)   metronidazole 500 mg (07/19/22 0427)   potassium PHOSPHATE IVPB (in mmol) 30 mmol (07/19/22 1137)   PRN Meds:.acetaminophen **OR** acetaminophen, alum & mag hydroxide-simeth, bisacodyl, HYDROmorphone (DILAUDID) injection, ipratropium-albuterol, ondansetron **OR** ondansetron (ZOFRAN) IV, mouth rinse, oxyCODONE, polyethylene glycol, sodium chloride flush  Antimicrobials: Anti-infectives (From admission, onward)    Start     Dose/Rate Route Frequency Ordered Stop   07/17/22 1200  cefTRIAXone (ROCEPHIN) 2 g in sodium chloride 0.9 % 100 mL IVPB        2 g 200 mL/hr over 30 Minutes Intravenous Every 24 hours 07/17/22 1115     07/16/22 1700  vancomycin (VANCOREADY) IVPB 1250 mg/250 mL  Status:  Discontinued        1,250 mg 166.7 mL/hr over 90 Minutes Intravenous Every 24 hours 07/15/22 1528 07/15/22 1918   07/16/22 1000  ceFEPIme (MAXIPIME) 2 g in sodium chloride 0.9 % 100 mL IVPB  Status:  Discontinued        2 g 200 mL/hr over 30 Minutes Intravenous Every 12 hours 07/15/22 2000 07/17/22 1115   07/15/22 2100  vancomycin (VANCOREADY) IVPB 1250 mg/250 mL  Status:  Discontinued        1,250 mg 166.7 mL/hr over 90 Minutes Intravenous Every 48 hours 07/15/22 2000 07/16/22 1123   07/15/22 2000  vancomycin (VANCOREADY) IVPB 1250 mg/250 mL  Status:  Discontinued        1,250 mg 166.7 mL/hr over 90 Minutes Intravenous Every 24 hours 07/15/22 1918 07/15/22 2000   07/15/22 1600  metroNIDAZOLE (FLAGYL) IVPB 500 mg        500 mg 100 mL/hr over 60 Minutes Intravenous Every 12 hours 07/15/22 1518     07/15/22 1530  ceFEPIme (MAXIPIME) 2 g in sodium chloride 0.9 % 100 mL IVPB  Status:  Discontinued        2 g 200 mL/hr over 30 Minutes Intravenous Every 8 hours 07/15/22 1522 07/15/22 2000   07/15/22 1530   vancomycin (VANCOREADY) IVPB 1250 mg/250 mL  Status:  Discontinued        1,250 mg 166.7 mL/hr over 90 Minutes Intravenous  Once 07/15/22 1523 07/15/22 1918   07/15/22 1500  cefTRIAXone (ROCEPHIN) 1 g in sodium chloride 0.9 % 100 mL IVPB  Status:  Discontinued        1 g 200 mL/hr over 30 Minutes Intravenous  Once 07/15/22 1452 07/15/22 1522   07/15/22 1500  azithromycin (ZITHROMAX) 500 mg in sodium chloride 0.9 % 250 mL IVPB  Status:  Discontinued        500 mg 250 mL/hr over 60 Minutes Intravenous  Once 07/15/22 1452 07/16/22 1127        I have personally reviewed the following labs and images: CBC: Recent Labs  Lab 07/15/22 1038 07/15/22 1905 07/16/22 0406 07/16/22 0903 07/17/22 0448 07/17/22 2100 07/18/22 0456 07/18/22 1700 07/19/22 0313  WBC 3.4*  --  3.7*  --  5.2  --  7.1  --  9.6  NEUTROABS  2.0  --   --   --   --   --   --   --   --   HGB 9.6*   < > 7.6*   < > 7.1* 9.4* 8.8* 9.1* 9.1*  HCT 30.2*   < > 24.5*   < > 23.1* 30.1* 27.4* 29.8* 29.0*  MCV 75.9*  --  76.3*  --  78.6*  --  80.8  --  81.5  PLT 111*  --  113*  --  146*  --  159  --  163   < > = values in this interval not displayed.   BMP &GFR Recent Labs  Lab 07/15/22 1904 07/16/22 0406 07/17/22 0448 07/18/22 0456 07/19/22 0313  NA 138 141 138 137 138  K 4.0 3.9 3.7 3.9 3.7  CL 94* 100 101 100 99  CO2 34* 35* 34* 33* 32  GLUCOSE 163* 132* 107* 99 136*  BUN 46* 42* 33* 19 16  CREATININE 1.61* 1.32* 1.10 0.89 0.81  CALCIUM 8.3* 8.3* 7.7* 7.7* 7.7*  MG  --   --  2.5* 2.1 2.0  PHOS 4.9*  --  2.1* 2.4* 1.7*   Estimated Creatinine Clearance: 47.6 mL/min (by C-G formula based on SCr of 0.81 mg/dL). Liver & Pancreas: Recent Labs  Lab 07/15/22 1904 07/16/22 0406 07/17/22 0448 07/18/22 0456 07/19/22 0313  AST  --  19 21  --   --   ALT  --  10 9  --   --   ALKPHOS  --  46 43  --   --   BILITOT  --  0.7 0.6  --   --   PROT  --  5.8* 5.1*  --   --   ALBUMIN 2.5* 2.2* 2.0* 2.1* 1.9*   No results  for input(s): "LIPASE", "AMYLASE" in the last 168 hours. No results for input(s): "AMMONIA" in the last 168 hours. Diabetic: No results for input(s): "HGBA1C" in the last 72 hours. No results for input(s): "GLUCAP" in the last 168 hours. Cardiac Enzymes: No results for input(s): "CKTOTAL", "CKMB", "CKMBINDEX", "TROPONINI" in the last 168 hours. No results for input(s): "PROBNP" in the last 8760 hours. Coagulation Profile: Recent Labs  Lab 07/17/22 0448  INR 1.2   Thyroid Function Tests: No results for input(s): "TSH", "T4TOTAL", "FREET4", "T3FREE", "THYROIDAB" in the last 72 hours. Lipid Profile: No results for input(s): "CHOL", "HDL", "LDLCALC", "TRIG", "CHOLHDL", "LDLDIRECT" in the last 72 hours. Anemia Panel: No results for input(s): "VITAMINB12", "FOLATE", "FERRITIN", "TIBC", "IRON", "RETICCTPCT" in the last 72 hours. Urine analysis:    Component Value Date/Time   COLORURINE YELLOW 07/15/2022 1002   APPEARANCEUR HAZY (A) 07/15/2022 1002   LABSPEC 1.023 07/15/2022 1002   PHURINE 5.0 07/15/2022 1002   GLUCOSEU NEGATIVE 07/15/2022 1002   HGBUR NEGATIVE 07/15/2022 1002   BILIRUBINUR NEGATIVE 07/15/2022 1002   KETONESUR NEGATIVE 07/15/2022 1002   PROTEINUR NEGATIVE 07/15/2022 1002   NITRITE NEGATIVE 07/15/2022 1002   LEUKOCYTESUR NEGATIVE 07/15/2022 1002   Sepsis Labs: Invalid input(s): "PROCALCITONIN", "LACTICIDVEN"  Microbiology: Recent Results (from the past 240 hour(s))  Resp Panel by RT-PCR (Flu A&B, Covid) Anterior Nasal Swab     Status: None   Collection Time: 07/15/22  9:52 AM   Specimen: Anterior Nasal Swab  Result Value Ref Range Status   SARS Coronavirus 2 by RT PCR NEGATIVE NEGATIVE Final    Comment: (NOTE) SARS-CoV-2 target nucleic acids are NOT DETECTED.  The SARS-CoV-2 RNA is generally detectable  in upper respiratory specimens during the acute phase of infection. The lowest concentration of SARS-CoV-2 viral copies this assay can detect is 138  copies/mL. A negative result does not preclude SARS-Cov-2 infection and should not be used as the sole basis for treatment or other patient management decisions. A negative result may occur with  improper specimen collection/handling, submission of specimen other than nasopharyngeal swab, presence of viral mutation(s) within the areas targeted by this assay, and inadequate number of viral copies(<138 copies/mL). A negative result must be combined with clinical observations, patient history, and epidemiological information. The expected result is Negative.  Fact Sheet for Patients:  EntrepreneurPulse.com.au  Fact Sheet for Healthcare Providers:  IncredibleEmployment.be  This test is no t yet approved or cleared by the [place] FDA and  has been authorized for detection and/or diagnosis of SARS-CoV-2 by FDA under an Emergency Use Authorization (EUA). This EUA will remain  in effect (meaning this test can be used) for the duration of the COVID-19 declaration under Section 564(b)(1) of the Act, 21 U.S.C.section 360bbb-3(b)(1), unless the authorization is terminated  or revoked sooner.       Influenza A by PCR NEGATIVE NEGATIVE Final   Influenza B by PCR NEGATIVE NEGATIVE Final    Comment: (NOTE) The Xpert Xpress SARS-CoV-2/FLU/RSV plus assay is intended as an aid in the diagnosis of influenza from Nasopharyngeal swab specimens and should not be used as a sole basis for treatment. Nasal washings and aspirates are unacceptable for Xpert Xpress SARS-CoV-2/FLU/RSV testing.  Fact Sheet for Patients: EntrepreneurPulse.com.au  Fact Sheet for Healthcare Providers: IncredibleEmployment.be  This test is not yet approved or cleared by the [place] FDA and has been authorized for detection and/or diagnosis of SARS-CoV-2 by FDA under an Emergency Use Authorization (EUA). This EUA will remain in effect (meaning  this test can be used) for the duration of the COVID-19 declaration under Section 564(b)(1) of the Act, 21 U.S.C. section 360bbb-3(b)(1), unless the authorization is terminated or revoked.  Performed at [facility], [place] [address], [place], [place] [address]   Gram stain     Status: None   Collection Time: 07/15/22  4:37 PM   Specimen: Fluid  Result Value Ref Range Status   Specimen Description FLUID PERITONEAL  Final   Special Requests NONE  Final   Gram Stain   Final    WBC SEEN NO ORGANISMS SEEN CYTOSPIN SMEAR Gram Stain Report Called to,Read Back By and Verified With: [email or web address] AT 2138 ON 07/15/22 BY LUZOLOP Performed at [facility], [place] [address] [place], [place] [address]    Report Status 07/15/2022 FINAL  Final  Body fluid culture w Gram Stain     Status: None   Collection Time: 07/15/22  4:37 PM   Specimen: Fluid  Result Value Ref Range Status   Specimen Description   Final    FLUID PERITONEAL Performed at [place] [address], [place], [place] [address]    Special Requests   Final    NONE Performed at [facility], [place] [address], [place], [place] [address]    Gram Stain NO WBC SEEN NO ORGANISMS SEEN   Final   Culture   Final    NO GROWTH 3 DAYS Performed at [facility], [place] [address], [place], [place] [address]    Report Status 07/19/2022 FINAL  Final  Culture, blood (routine x 2)     Status: None (Preliminary result)  Collection Time: 07/15/22  7:04 PM   Specimen: BLOOD  Result Value Ref Range Status   Specimen Description   Final    BLOOD RIGHT ANTECUBITAL Performed at [place] [address] [place], [place] [address]    Special Requests   Final    BOTTLES DRAWN AEROBIC ONLY Blood Culture adequate volume Performed at [place] [address] [place], [place] [address]    Culture   Final    NO GROWTH 4  DAYS Performed at [facility], [place] [address], [place], [place] [address]    Report Status PENDING  Incomplete  Culture, blood (routine x 2)     Status: None (Preliminary result)   Collection Time: 07/15/22  7:06 PM   Specimen: BLOOD LEFT HAND  Result Value Ref Range Status   Specimen Description   Final    BLOOD LEFT HAND Performed at [facility], [place] [address], [place], [place] [address]    Special Requests   Final    BOTTLES DRAWN AEROBIC ONLY Blood Culture adequate volume Performed at [place] [address], [place], [place] [address]    Culture   Final    NO GROWTH 4 DAYS Performed at [facility], [place] [address], [place], [place] [address]    Report Status PENDING  Incomplete  MRSA Next Gen by PCR, Nasal     Status: None   Collection Time: 07/15/22  8:51 PM   Specimen: Nasal Mucosa; Nasal Swab  Result Value Ref Range Status   MRSA by PCR Next Gen NOT DETECTED NOT DETECTED Final    Comment: (NOTE) The GeneXpert MRSA Assay (FDA approved for NASAL specimens only), is one component of a comprehensive MRSA colonization surveillance program. It is not intended to diagnose MRSA infection nor to guide or monitor treatment for MRSA infections. Test performance is not FDA approved in patients less than 46 years old. Performed at [facility], [place] [address], [place], [place] [address]   Culture, Respiratory w Gram Stain     Status: None   Collection Time: 07/16/22  2:25 AM   Specimen: Expectorated Sputum; Respiratory  Result Value Ref Range Status   Specimen Description   Final    EXPECTORATED SPUTUM Performed at [place] [address] [place], [place] [address]    Special Requests   Final    Immunocompromised Performed at [facility], [place] [address], [place], [place] [address]    Gram Stain   Final    RARE SQUAMOUS EPITHELIAL CELLS PRESENT RARE GRAM VARIABLE  ROD RARE GRAM POSITIVE COCCI IN PAIRS    Culture   Final    FEW Normal respiratory flora-no Staph aureus or Pseudomonas seen Performed at [facility], [address] [place], [place] [address]    Report Status 07/18/2022 FINAL  Final    Radiology Studies: No results found.    Ulyses Panico T. Cole  If 7PM-7AM, please contact night-coverage [email or web address] 07/19/2022, 2:12 PM

## 2022-07-20 DIAGNOSIS — C3491 Malignant neoplasm of unspecified part of right bronchus or lung: Secondary | ICD-10-CM | POA: Diagnosis not present

## 2022-07-20 DIAGNOSIS — J9621 Acute and chronic respiratory failure with hypoxia: Secondary | ICD-10-CM | POA: Diagnosis not present

## 2022-07-20 DIAGNOSIS — D6959 Other secondary thrombocytopenia: Secondary | ICD-10-CM | POA: Diagnosis not present

## 2022-07-20 DIAGNOSIS — A419 Sepsis, unspecified organism: Secondary | ICD-10-CM | POA: Diagnosis not present

## 2022-07-20 LAB — CULTURE, BLOOD (ROUTINE X 2)
Culture: NO GROWTH
Culture: NO GROWTH
Special Requests: ADEQUATE
Special Requests: ADEQUATE

## 2022-07-20 LAB — CYTOLOGY - NON PAP

## 2022-07-20 MED ORDER — ORAL CARE MOUTH RINSE: 15.0000 mL | OROMUCOSAL | Status: DC | PRN

## 2022-07-20 MED ORDER — POTASSIUM & SODIUM PHOSPHATES 280-160-250 MG PO PACK
1.0000 | PACK | Freq: Three times a day (TID) | ORAL | Status: DC
  Administered 2022-07-20 – 2022-07-25 (×15): 1 via ORAL
  Filled 2022-07-20 (×24): qty 1

## 2022-07-20 MED ORDER — ORAL CARE MOUTH RINSE
15.0000 mL | OROMUCOSAL | Status: DC
  Administered 2022-07-20 – 2022-07-25 (×11): 15 mL via OROMUCOSAL

## 2022-07-20 NOTE — Plan of Care (Signed)

## 2022-07-20 NOTE — Progress Notes (Signed)
[facility]                                                                               Jeffrey Watson, is a 81 y.o. male, DOB - 1941-05-03, YFV:[id number] Admit date - 07/15/2022    Outpatient Primary MD for the patient is System, Provider Not In  LOS - 5  days    Brief summary   81 y.o. 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  Cultures obtained.  He was started on broad-spectrum antibiotics.  CT chest, abdomen and pelvis negative for PE but showed distal esophageal wall thickening concerning for esophagitis, emphysema, RLL infiltrate/atelectasis, large ascites, omental nodularity concerning for metastasis and possible small pelvic hemorrhage.  Patient had paracentesis with removal of 2.2 bloody peritoneal fluid.   Blood cultures NGTD.  MRSA PCR negative.  Peritoneal fluid culture NGTD.  Antibiotics de-escalated to IV ceftriaxone and Flagyl.  In regards to anemia, hemoglobin reach 19 at 7.1.  Transfused 1 unit with appropriate response.  H&H stable.   Patient developed increased oxygen requirement the night of 10/29.  CXR with worsening right lung infiltrate concerning for aspiration.   Assessment & Plan    Assessment and Plan:  Severe sepsis with thrombocytopenia and hypoxic respiratory failure due to aspiration pneumonia and SBP: POA.  He has ascites with tenderness and rebound tenderness.  Peritoneal fluid was cloudy and bloody.  Cell count was not performed due to blood clot in peritoneal fluid.  Patient is  immunocompromised.   Blood and peritoneal fluid cultures NGTD.  MRSA PCR nonreactive.  Lactic acidosis resolved.   pro calcitonin improved.    Hemodynamically stable.  -Antibiotics de-escalated to ceftriaxone, Flagyl and Zithromax -Aspiration precaution-HOB 45 degrees -Continue Protonix -    Stage IV lung cancer/cancer related pain: Followed with [facility] oncology team.   On palliative chemo with  Pembrolizumab and Pemetrexed.   Last treatment on 10/19.  Imaging raises concern for peritoneal  metastasis and ascitic fluid positive for malignant cells. -Pain control -Outpatient follow-up with oncology -Palliative medicine consulted for goals of care.    Acute on chronic respiratory failure with hypoxia:    CXR with increased right lung and LLL infiltrate as well as atelectasis.   - pt at home on 5 lit of [place] oxygen . Currently requiring about 7 lit/min  I suspect going aspiration in the setting of patient's dysphagia.  SLp recommended liquid diet.  He is also at risk for PE without chemical VTE prophylaxis due to GI bleed -Continue antibiotics as above -Continue LABA/LAMA/ICS with as needed DuoNeb. -Gave him incentive spirometry and encouraged him to use -Encouraged smoking cessation -Wean oxygen as able- unfortunately still on 7 lit of [place] oxygen unable to wean today. Will repeat CXR in am.  -Aspiration precaution -Appreciate help by SLP  Emphysema/chronic COPD: -Management as above.   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  Peritoneal fluid with blood clot to calculate cell counts. SAAG -0.6 arguing against PHTN.  His abdomen is still full and tender. 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  - discussed with family at bedside.    Acute blood loss anemia/possible intra-abdominal bleed: CT showed small volume pelvic hemorrhage.  Peritoneal fluid reportedly bloody.  He also had  chemotherapy on 10/19.   Hemoglobin has been around 9 and stable.   AKI/azotemia: Baseline Cr 0.9-1.0. Resolved .    Pancytopenia: due to chemo and cancer.   Leukopenia and thrombocytopenia resolved.  Hgb improved. -Continue monitoring   Dysphagia/possible esophagitis/distal esophageal wall thickening -Aspiration precaution, SLP eval and PPI   Goal of care counseling: Grim prognosis. -Palliative medicine consulted and following.    Hypernatremia:resolved    Hypophosphatemia: -Monitor replenish as appropriate   Grief: recently lost his mother.  Wanted to go to mother's funeral but too weak to get up. -Emotional support   Failure to thrive/severe malnutrition/generalized weakness Body mass index is 15.99 kg/m. -Palliative medicine following -Dietitian consulted. -PT/OT       RN Pressure Injury Documentation:    Malnutrition Type:  Nutrition Problem: Severe Malnutrition Etiology: chronic illness (lung cancer w/ mets)   Malnutrition Characteristics:  Signs/Symptoms: severe fat depletion, severe muscle depletion, percent weight loss Percent weight loss: 21.5 % (less than 1 month)   Nutrition Interventions:  Interventions: Ensure Enlive (each supplement provides 350kcal and 20 grams of protein)  Estimated body mass index is 15.99 kg/m as calculated from the following:   Height as of this encounter: 5\' 7"  (1.702 m).   Weight as of this encounter: 46.3 kg.  Code Status: full code.  DVT Prophylaxis:  SCDs Start: 07/15/22 1603   Level of Care: Level of care: Progressive Family Communication: Updated patient's family at bedside.   Disposition Plan:     Remains inpatient appropriate: on IV antibiotics.   Procedures:  None.   Consultants:   None.   Antimicrobials:   Anti-infectives (From admission, onward)    Start     Dose/Rate Route Frequency Ordered Stop   07/17/22 1200  cefTRIAXone (ROCEPHIN) 2 g in sodium chloride 0.9 % 100 mL IVPB        2  g 200 mL/hr over 30 Minutes Intravenous Every 24 hours 07/17/22 1115     07/16/22 1700  vancomycin (VANCOREADY) IVPB 1250 mg/250 mL  Status:  Discontinued        1,250 mg 166.7 mL/hr over 90 Minutes Intravenous Every 24 hours 07/15/22 1528 07/15/22 1918   07/16/22 1000  ceFEPIme (MAXIPIME) 2 g in sodium chloride 0.9 % 100 mL IVPB  Status:  Discontinued        2 g 200 mL/hr over 30 Minutes Intravenous Every 12 hours 07/15/22 2000 07/17/22 1115   07/15/22 2100  vancomycin (VANCOREADY) IVPB 1250 mg/250 mL  Status:  Discontinued        1,250 mg 166.7 mL/hr over 90 Minutes Intravenous Every 48 hours 07/15/22 2000 07/16/22 1123   07/15/22 2000  vancomycin (VANCOREADY) IVPB 1250 mg/250 mL  Status:  Discontinued        1,250 mg 166.7 mL/hr over 90 Minutes Intravenous Every 24 hours 07/15/22 1918 07/15/22 2000   07/15/22 1600  metroNIDAZOLE (FLAGYL) IVPB 500 mg        500 mg 100 mL/hr over 60 Minutes Intravenous  Every 12 hours 07/15/22 1518     07/15/22 1530  ceFEPIme (MAXIPIME) 2 g in sodium chloride 0.9 % 100 mL IVPB  Status:  Discontinued        2 g 200 mL/hr over 30 Minutes Intravenous Every 8 hours 07/15/22 1522 07/15/22 2000   07/15/22 1530  vancomycin (VANCOREADY) IVPB 1250 mg/250 mL  Status:  Discontinued        1,250 mg 166.7 mL/hr over 90 Minutes Intravenous  Once 07/15/22 1523 07/15/22 1918   07/15/22 1500  cefTRIAXone (ROCEPHIN) 1 g in sodium chloride 0.9 % 100 mL IVPB  Status:  Discontinued        1 g 200 mL/hr over 30 Minutes Intravenous  Once 07/15/22 1452 07/15/22 1522   07/15/22 1500  azithromycin (ZITHROMAX) 500 mg in sodium chloride 0.9 % 250 mL IVPB  Status:  Discontinued        500 mg 250 mL/hr over 60 Minutes Intravenous  Once 07/15/22 1452 07/16/22 1127        Medications  Scheduled Meds:  Chlorhexidine Gluconate Cloth  6 each Topical Daily   feeding supplement  237 mL Oral TID BM   folic acid  1 mg Oral Daily   nicotine  21 mg Transdermal Daily   mouth rinse   15 mL Mouth Rinse 4 times per day   tamsulosin  0.4 mg Oral Daily   Continuous Infusions:  cefTRIAXone (ROCEPHIN)  IV 2 g (07/20/22 1120)   metronidazole 500 mg (07/20/22 0409)   PRN Meds:.acetaminophen **OR** acetaminophen, alum & mag hydroxide-simeth, bisacodyl, HYDROmorphone (DILAUDID) injection, ipratropium-albuterol, ondansetron **OR** ondansetron (ZOFRAN) IV, mouth rinse, mouth rinse, oxyCODONE, polyethylene glycol, sodium chloride flush    Subjective:   Sheryl Towell was seen and examined today.  Pain not well controlled.   Objective:   Vitals:   07/19/22 1505 07/19/22 2027 07/20/22 0516 07/20/22 1238  BP: (!) 86/74 121/67 115/64 113/66  Pulse: 99 100 93 (!) 102  Resp: 20 18 17 18   Temp: 98.6 F (37 C) 97.9 F (36.6 C) 99.2 F (37.3 C) 99.4 F (37.4 C)  TempSrc: Oral Oral Oral Oral  SpO2: 97% 93% 98% 93%  Weight:      Height:        Intake/Output Summary (Last 24 hours) at 07/20/2022 1359 Last data filed at 07/20/2022 1057 Gross per 24 hour  Intake 1092.33 ml  Output 351 ml  Net 741.33 ml   Filed Weights   07/16/22 0500  Weight: 46.3 kg     Exam General exam: Ill appearing gentleman, on 7 lit of [place] oxygen.  Respiratory system: bilateral scattered rhonchi. Air entry fair. No wheezing heard.  Cardiovascular system: S1 & S2 heard, RRR. No JVD, murmurs, No pedal edema. Gastrointestinal system: Abdomen is nondistended, soft and nontender.  Normal bowel sounds heard. Central nervous system: Alert and oriented. No focal neurological deficits. Extremities: Symmetric 5 x 5 power. Skin: No rashes,  Psychiatry:  Mood & affect appropriate.    Data Reviewed:  I have personally reviewed following labs and imaging studies   CBC Lab Results  Component Value Date   WBC 9.6 07/19/2022   RBC 3.56 (L) 07/19/2022   HGB 9.1 (L) 07/19/2022   HCT 29.0 (L) 07/19/2022   MCV 81.5 07/19/2022   MCH 25.6 (L) 07/19/2022   PLT 163 07/19/2022   MCHC 31.4 07/19/2022   RDW 18.3  (H) 07/19/2022   LYMPHSABS 0.7 07/15/2022   MONOABS 0.6 07/15/2022   EOSABS 0.0  07/15/2022   BASOSABS 0.0 16/06/9603     Last metabolic panel Lab Results  Component Value Date   NA 138 07/19/2022   K 3.7 07/19/2022   CL 99 07/19/2022   CO2 32 07/19/2022   BUN 16 07/19/2022   CREATININE 0.81 07/19/2022   GLUCOSE 136 (H) 07/19/2022   GFRNONAA >60 07/19/2022   GFRAA 101 02/28/2018   CALCIUM 7.7 (L) 07/19/2022   PHOS 1.7 (L) 07/19/2022   PROT 5.1 (L) 07/17/2022   ALBUMIN 1.9 (L) 07/19/2022   BILITOT 0.6 07/17/2022   ALKPHOS 43 07/17/2022   AST 21 07/17/2022   ALT 9 07/17/2022   ANIONGAP 7 07/19/2022    CBG (last 3)  No results for input(s): "GLUCAP" in the last 72 hours.    Coagulation Profile: Recent Labs  Lab 07/17/22 0448  INR 1.2     Radiology Studies: No results found.     Hosie Poisson M.D. [facility] 07/20/2022, 1:59 PM  Available via Epic secure chat 7am-7pm After 7 pm, please refer to night coverage provider listed on amion.

## 2022-07-20 NOTE — Progress Notes (Signed)
Chaplain engaged in an initial visit with Jeffrey Watson.  He expressed feeling a level 10 type of pain and that he felt like he wasn't being helped at the hospital.  When Chaplain asked what he felt the medical team was not doing, he stated "fixing the problem."  Chaplain assessed that there is some unawareness or confusion around Jeffrey Watson's interventions or current health state.    Chaplain offered support by letting the nurse know of his pain and reaching out to palliative care team.  Chaplain plans to follow-up with his wife today.     07/20/22 1100  Clinical Encounter Type  Visited With Patient  Visit Type Initial

## 2022-07-21 ENCOUNTER — Inpatient Hospital Stay (HOSPITAL_COMMUNITY): Payer: Medicare Other

## 2022-07-21 DIAGNOSIS — J9621 Acute and chronic respiratory failure with hypoxia: Secondary | ICD-10-CM | POA: Diagnosis not present

## 2022-07-21 DIAGNOSIS — Z7189 Other specified counseling: Secondary | ICD-10-CM | POA: Diagnosis not present

## 2022-07-21 DIAGNOSIS — D6959 Other secondary thrombocytopenia: Secondary | ICD-10-CM | POA: Diagnosis not present

## 2022-07-21 DIAGNOSIS — C3491 Malignant neoplasm of unspecified part of right bronchus or lung: Secondary | ICD-10-CM | POA: Diagnosis not present

## 2022-07-21 DIAGNOSIS — Z515 Encounter for palliative care: Secondary | ICD-10-CM | POA: Diagnosis not present

## 2022-07-21 DIAGNOSIS — A419 Sepsis, unspecified organism: Secondary | ICD-10-CM | POA: Diagnosis not present

## 2022-07-21 MED ORDER — OXYCODONE HCL 5 MG PO TABS
5.0000 mg | ORAL_TABLET | ORAL | Status: DC | PRN
  Administered 2022-07-21 – 2022-07-24 (×5): 5 mg via ORAL
  Filled 2022-07-21 (×6): qty 1

## 2022-07-21 NOTE — Progress Notes (Signed)
Received communication that PT needs nebulizer treatment. RT entered PT room, PT on 7 LPM nasal cannula with Sp02 99% (RT decreased to 5 LPM). PT states he uses 2 lpm oxygen at home. Current Sp02 per MD >=92%. Sp02 on 5 LPM 97%, RT decreased again to 3 LPM. PT states he is breathing "fine" at this time. PT does not appear to be in respiratory distress at this time. Before leaving room, PT asked again about breathing treatment- again states "don't want it, breathing fine."

## 2022-07-21 NOTE — Progress Notes (Signed)
Pt with increasing tachypnea. Respiratory paged and primary RN aware.

## 2022-07-21 NOTE — Progress Notes (Signed)
Consultation Note Date: 07/21/2022   Patient Name: Jeffrey Watson  DOB: [birth year]  MRN: [id number]  Age / Sex: 81 y.o., male  PCP: System, Provider Not In Referring Physician: Hosie Poisson, MD  Reason for Consultation:   HPI/Patient Profile: 81 y.o. 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  Patient diagnosed with sepsis related to aspiration pneumonia also with concern for SBP.  Patient's last chemotherapy 10/19. Imaging and fluid analysis consistent with peritoneal. PMT consulted to discuss [place].   Primary Decision Maker Patient and Spouse Jeffrey Watson  Discussion: Lengthy conversation between patient and wife. Discussed the diagnosis and prognosis with the wife and patient including nature of pt's chemotherapy to be palliative instead of curative. Pt stated he wanted to be at his mother's funeral service, who passed away at age of 27 but is unable to due to his health status. He wants decisions to be made by his wife Jeffrey Watson in case he is not able to make decisions himself. After lengthy discussion pt agreeable to change code status to DNR and proceed with home hospice.  Palliative medicine is specialized medical care for people living with serious illness. It focuses on providing relief from the symptoms and stress of a serious illness. The goal is to improve quality of life for both the patient and the family. Goals of care: Broad aims of medical therapy in relation to the patient's values and preferences. Our aim is to provide medical care aimed at enabling patients to achieve the goals that matter most to  them, given the circumstances of their particular medical situation and their constraints.      SUMMARY OF RECOMMENDATIONS   -Continue treatment as previous with increase in his pain regimen to oxycodone 5 mg q3hrs prn.  -Change code status to DNR -Discharge pt with home hospice -Have Chaplain assist patient and wife with completion of ACP paper work.    Code Status/Advance Care Planning: DNR   Prognosis:   < 3 months  Discharge Planning: Home with Hospice  Primary Diagnoses: Present on Admission:  Severe sepsis with thrombocytopenia ([place])  Essential hypertension  COPD, mild (HCC)  BPH (benign prostatic hyperplasia)  Non-small cell lung cancer (NSCLC) ([place])  Centrilobular emphysema (HCC)  Adenocarcinoma of right lung ([place])  Acute on chronic respiratory failure with hypoxia ([place])  Current every day smoker   ROS: Negative unless stated in the discussion.  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 Palliative medicine will continue to follow and assist as needed.  Time Total: 50 Greater than 50%  of this time was spent counseling  and coordinating care related to the above assessment and plan.  Signed by: Loistine Chance MD [place] [facility]    Please contact Palliative Medicine Team phone at [phone] for questions and concerns.  For individual provider: See Shea Evans

## 2022-07-21 NOTE — Progress Notes (Signed)
Chaplain engaged in a follow-up visit with Jeffrey Watson, and met his wife for the first time.  Mrs. Taboada wanted to complete paperwork for healthcare and durable power of attorney.  As Chaplain assessed that [name] was in distress due to his breathing, Chaplain relayed to wife that because they are married, there is already a legal agreement in place giving her the ability to make decisions for [name] medically if he becomes incapacitated.  Chaplain assessed that there was no need to complete the Advanced Directive, Healthcare POA, at this time due to [name]'s current state, but did offer to still complete the paperwork for them if they deemed necessary.   Mrs. Lumsden relayed that she was really concerned about banking and financial POA.  She desires to be able to pay bills and care for any medical needs for [name] as he transitions to home with hospice. She expressed some frustration as she recently had to care for [name]'s mother before her death recently.  She talked about the financial burden that was incurred and how [name] is still currently at a funeral home.  Mrs. Connelly also shared that her son was recently killed in June.  She voiced that she if feeling at her wits end.  She noted several times that she hasn't been able to grieve her son.   Chaplain provided reflective listening and support to Mrs. Hitch by answering pressing questions and helping her obtain pertinent documents.  Chaplain let her know she would need to find her own notary regarding Durable POA.  Chaplain also showed Mrs. Prokop how to order food for herself and [name].  She stated that she is diabetic.  Mrs. Bostrom left her medicine at home and had not eaten to care for herself.  Chaplain checked in regarding various resources for her and let her know what the hospital could do.     07/21/22 1400  Clinical Encounter Type  Visited With Patient and family together  Visit Type Follow-up;Spiritual support;Social support

## 2022-07-21 NOTE — Progress Notes (Signed)
Pt back from radiology, made primary RN aware of spo2, titrated oxygen up to 7l via HFNC.

## 2022-07-21 NOTE — Progress Notes (Signed)
PMT no charge note.  Received call from RN colleague that the patient's daughter was at bedside, did not agree with DNR and hospice philosophy of care and is hopeful for the patient receiving more chemotherapy. Discussed with nursing colleague, call placed into patient's room and also to the patient's wife's cell, unable to reach at this time.  Plan: PMT to continue to engage patient and family as a unit and continue efforts at trust building and compassionate empathic care. Full code for tonight, will resume goals of care discussions again on 07-22-22.  PMT to follow.  No charge Loistine Chance MD [place] palliative.

## 2022-07-21 NOTE — Progress Notes (Signed)
Attempted to se pt x2 today. Pt at Xray and then pt and significant other in convercation with MDs. Pt not feeling well today and on increased O2.  Will check back as schedule allows. Pt was d/c'd from PT services prior to decline in status. Feel pt would benefit  from reconsult for PT. Jinger Neighbors, [place] [phone]

## 2022-07-21 NOTE — Progress Notes (Signed)
[facility]                                                                               Jeffrey Watson, is a 81 y.o. male, DOB - [birth year], VQQ:[id number] Admit date - 07/15/2022    Outpatient Primary MD for the patient is System, Provider Not In  LOS - 6  days    Brief summary   81 y.o. 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  Cultures obtained.  He was started on broad-spectrum antibiotics.  CT chest, abdomen and pelvis negative for PE but showed distal esophageal wall thickening concerning for esophagitis, emphysema, RLL infiltrate/atelectasis, large ascites, omental nodularity concerning for metastasis and possible small pelvic hemorrhage.  Patient had paracentesis with removal of 2.2 bloody peritoneal fluid.   Blood cultures NGTD.  MRSA PCR negative.  Peritoneal fluid culture NGTD.  Antibiotics de-escalated to IV ceftriaxone and Flagyl.  In regards to anemia, hemoglobin reach 19 at 7.1.  Transfused 1 unit with appropriate response.  H&H stable.   Patient developed increased oxygen requirement the night of 10/29.  CXR with worsening right lung infiltrate concerning for aspiration. Repeat CXR does not show any improvement, unable to wean his oxygen down.  Currently requiring about 7 lit of [place] oxygen. Discussed with wife at bedside.    Assessment & Plan    Assessment and Plan:  Severe sepsis with thrombocytopenia and hypoxic respiratory failure due to aspiration pneumonia and SBP: POA.  He has ascites with tenderness and  rebound tenderness.  Peritoneal fluid was cloudy and bloody.  Cell count was not performed due to blood clot in peritoneal fluid.  Patient is immunocompromised.   Blood and peritoneal fluid cultures NGTD.  MRSA PCR nonreactive.  Lactic acidosis resolved.   pro calcitonin improved.    Hemodynamically stable.  -Antibiotics de-escalated to ceftriaxone, Flagyl and Zithromax -Aspiration precaution-HOB 45 degrees -Continue Protonix Repeat CXR does not show any improvement, unable to wean his oxygen down.  Currently requiring about 7 lit of [place] oxygen. Discussed with wife at bedside.  -    Stage IV lung cancer/cancer related pain: Followed with [facility] oncology team.   On palliative chemo with  Pembrolizumab and Pemetrexed.   Last treatment on 10/19.  Imaging raises concern for peritoneal  metastasis and ascitic fluid positive for malignant cells. -Pain control -Outpatient follow-up with oncology -Palliative medicine consulted for goals of care.  - wife at bedside, does not want to escalate care, wants to focus on comfort and plan for home with hospice.  - appreciate palliative care input.    Acute on chronic respiratory failure with hypoxia:    CXR with increased right lung and LLL infiltrate as well as atelectasis.   - pt at home on 5 lit of [place] oxygen . Currently requiring about  7 lit/min  I suspect going aspiration in the setting of patient's dysphagia.  SLp recommended liquid diet.  He is also at risk for PE without chemical VTE prophylaxis due to GI bleed -Continue antibiotics as above -Continue LABA/LAMA/ICS with as needed DuoNeb. -Gave him incentive spirometry and encouraged him to use -Encouraged smoking cessation -Repeat CXR does not show any improvement, unable to wean his oxygen down.  Currently requiring about 7 lit of  oxygen. Discussed with wife at bedside.  -Aspiration precaution -Appreciate help by SLP   Emphysema/chronic COPD: -Management as above.   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  Peritoneal fluid with blood clot to calculate cell counts. SAAG -0.6 arguing against PHTN.  His abdomen is still full and tender. 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  - discussed with family at bedside.  No escalation of care at this time.    Acute blood loss anemia/possible intra-abdominal bleed: CT showed small volume pelvic hemorrhage.  Peritoneal fluid reportedly bloody.  He also had chemotherapy on 10/19.   Hemoglobin has been around 9 and stable.   AKI/azotemia: Baseline Cr 0.9-1.0. Resolved .    Pancytopenia: due to chemo and cancer.   Leukopenia and thrombocytopenia resolved.  Hgb improved. -Continue monitoring   Dysphagia/possible esophagitis/distal esophageal wall thickening -Aspiration precaution, SLP eval and PPI   Goal of care counseling: Grim prognosis. -Palliative medicine consulted and following.    Hypernatremia:resolved    Hypophosphatemia: -Monitor replenish as appropriate   Grief: recently lost his mother.  Wanted to go to mother's funeral but too weak to get up. -Emotional support   Failure to thrive/severe malnutrition/generalized weakness Body mass index is 15.99 kg/m. -Palliative medicine following -Dietitian consulted. -PT/OT   RN Pressure Injury Documentation:    Malnutrition Type:  Nutrition Problem: Severe Malnutrition Etiology: chronic illness (lung cancer w/ mets)   Malnutrition Characteristics:  Signs/Symptoms: severe fat depletion, severe muscle depletion, percent weight loss Percent weight loss: 21.5 % (less than 1 month)   Nutrition Interventions:  Interventions: Ensure Enlive (each supplement provides 350kcal and 20 grams of protein)  Estimated body mass index is 15.99 kg/m as calculated from the following:   Height as of this encounter: 5\' 7"  (1.702 m).   Weight as of this encounter: 46.3  kg.  Code Status: full code.  DVT Prophylaxis:  SCDs Start: 07/15/22 1603   Level of Care: Level of care: Progressive Family Communication: Updated patient's family at bedside.   Disposition Plan:     Remains inpatient appropriate: on IV antibiotics.   Procedures:  None.   Consultants:   Palliative care.   Antimicrobials:   Anti-infectives (From admission, onward)    Start     Dose/Rate Route Frequency Ordered Stop   07/17/22 1200  cefTRIAXone (ROCEPHIN) 2 g in sodium chloride 0.9 % 100 mL IVPB        2 g 200 mL/hr over 30 Minutes Intravenous Every 24 hours 07/17/22 1115     07/16/22 1700  vancomycin (VANCOREADY) IVPB 1250 mg/250 mL  Status:  Discontinued        1,250 mg 166.7 mL/hr over 90 Minutes Intravenous Every 24 hours 07/15/22 1528 07/15/22 1918   07/16/22 1000  ceFEPIme (MAXIPIME) 2 g in sodium chloride 0.9 % 100 mL IVPB  Status:  Discontinued        2 g 200 mL/hr over 30 Minutes Intravenous Every 12 hours 07/15/22 2000 07/17/22 1115  07/15/22 2100  vancomycin (VANCOREADY) IVPB 1250 mg/250 mL  Status:  Discontinued        1,250 mg 166.7 mL/hr over 90 Minutes Intravenous Every 48 hours 07/15/22 2000 07/16/22 1123   07/15/22 2000  vancomycin (VANCOREADY) IVPB 1250 mg/250 mL  Status:  Discontinued        1,250 mg 166.7 mL/hr over 90 Minutes Intravenous Every 24 hours 07/15/22 1918 07/15/22 2000   07/15/22 1600  metroNIDAZOLE (FLAGYL) IVPB 500 mg        500 mg 100 mL/hr over 60 Minutes Intravenous Every 12 hours 07/15/22 1518     07/15/22 1530  ceFEPIme (MAXIPIME) 2 g in sodium chloride 0.9 % 100 mL IVPB  Status:  Discontinued        2 g 200 mL/hr over 30 Minutes Intravenous Every 8 hours 07/15/22 1522 07/15/22 2000   07/15/22 1530  vancomycin (VANCOREADY) IVPB 1250 mg/250 mL  Status:  Discontinued        1,250 mg 166.7 mL/hr over 90 Minutes Intravenous  Once 07/15/22 1523 07/15/22 1918   07/15/22 1500  cefTRIAXone (ROCEPHIN) 1 g in sodium chloride 0.9 % 100 mL IVPB   Status:  Discontinued        1 g 200 mL/hr over 30 Minutes Intravenous  Once 07/15/22 1452 07/15/22 1522   07/15/22 1500  azithromycin (ZITHROMAX) 500 mg in sodium chloride 0.9 % 250 mL IVPB  Status:  Discontinued        500 mg 250 mL/hr over 60 Minutes Intravenous  Once 07/15/22 1452 07/16/22 1127        Medications  Scheduled Meds:  Chlorhexidine Gluconate Cloth  6 each Topical Daily   feeding supplement  237 mL Oral TID BM   folic acid  1 mg Oral Daily   nicotine  21 mg Transdermal Daily   mouth rinse  15 mL Mouth Rinse 4 times per day   potassium & sodium phosphates  1 packet Oral TID WC & HS   tamsulosin  0.4 mg Oral Daily   Continuous Infusions:  cefTRIAXone (ROCEPHIN)  IV 2 g (07/20/22 1120)   metronidazole 500 mg (07/21/22 0458)   PRN Meds:.acetaminophen **OR** acetaminophen, alum & mag hydroxide-simeth, bisacodyl, HYDROmorphone (DILAUDID) injection, ipratropium-albuterol, ondansetron **OR** ondansetron (ZOFRAN) IV, mouth rinse, mouth rinse, oxyCODONE, polyethylene glycol, sodium chloride flush    Subjective:   Lonnel Gjerde was seen and examined today.  Requesting for pain meds.    Objective:   Vitals:   07/21/22 0756 07/21/22 1011 07/21/22 1124 07/21/22 1127  BP: 126/76 130/72 131/68   Pulse: (!) 103 (!) 107 (!) 108   Resp: 17     Temp: 98.8 F (37.1 C)     TempSrc: Oral     SpO2:  92% (!) 85% (!) 88%  Weight:      Height:        Intake/Output Summary (Last 24 hours) at 07/21/2022 1258 Last data filed at 07/21/2022 1100 Gross per 24 hour  Intake 1068.74 ml  Output 1200 ml  Net -131.26 ml    Filed Weights   07/16/22 0500  Weight: 46.3 kg     Exam General exam: ill appearing gentleman, on 7 lit of [place] oxygen, with sme on talking.  Respiratory system: diminished air entry at bases. On 7 lit of [place] oxygen.  Cardiovascular system: S1 & S2 heard, RRR. No JVD, No pedal edema. Gastrointestinal system: Abdomen is soft, mildly distended.  Normal bowel  sounds heard. Central nervous system:  Alert and oriented to person and place only.  Extremities: no edema.  Skin: No rashes, Psychiatry:mood is okay.     Data Reviewed:  I have personally reviewed following labs and imaging studies   CBC Lab Results  Component Value Date   WBC 9.6 07/19/2022   RBC 3.56 (L) 07/19/2022   HGB 9.1 (L) 07/19/2022   HCT 29.0 (L) 07/19/2022   MCV 81.5 07/19/2022   MCH 25.6 (L) 07/19/2022   PLT 163 07/19/2022   MCHC 31.4 07/19/2022   RDW 18.3 (H) 07/19/2022   LYMPHSABS 0.7 07/15/2022   MONOABS 0.6 07/15/2022   EOSABS 0.0 07/15/2022   BASOSABS 0.0 97/67/3419     Last metabolic panel Lab Results  Component Value Date   NA 138 07/19/2022   K 3.7 07/19/2022   CL 99 07/19/2022   CO2 32 07/19/2022   BUN 16 07/19/2022   CREATININE 0.81 07/19/2022   GLUCOSE 136 (H) 07/19/2022   GFRNONAA >60 07/19/2022   GFRAA 101 02/28/2018   CALCIUM 7.7 (L) 07/19/2022   PHOS 1.7 (L) 07/19/2022   PROT 5.1 (L) 07/17/2022   ALBUMIN 1.9 (L) 07/19/2022   BILITOT 0.6 07/17/2022   ALKPHOS 43 07/17/2022   AST 21 07/17/2022   ALT 9 07/17/2022   ANIONGAP 7 07/19/2022    CBG (last 3)  No results for input(s): "GLUCAP" in the last 72 hours.    Coagulation Profile: Recent Labs  Lab 07/17/22 0448  INR 1.2      Radiology Studies: DG Chest 2 View  Result Date: 07/21/2022 CLINICAL DATA:  Difficulty breathing, follow-up effusions EXAM: CHEST - 2 VIEW COMPARISON:  07/18/2022 FINDINGS: Cardiac shadow is stable. Right chest wall port is again noted. Emphysematous changes are again seen and stable. Bilateral pleural effusions are again identified stable from the prior exam. Patchy airspace opacity remains in the right base. IMPRESSION: No significant interval change from the prior exam. Persistent right basilar infiltrate and small effusions are seen. Underlying emphysematous changes are noted. Electronically Signed   By: Inez Catalina M.D.   On: 07/21/2022 11:48        Hosie Poisson M.D. [facility] 07/21/2022, 12:58 PM  Available via Epic secure chat 7am-7pm After 7 pm, please refer to night coverage provider listed on amion.

## 2022-07-22 DIAGNOSIS — C3491 Malignant neoplasm of unspecified part of right bronchus or lung: Secondary | ICD-10-CM | POA: Diagnosis not present

## 2022-07-22 DIAGNOSIS — D6959 Other secondary thrombocytopenia: Secondary | ICD-10-CM | POA: Diagnosis not present

## 2022-07-22 DIAGNOSIS — A419 Sepsis, unspecified organism: Secondary | ICD-10-CM | POA: Diagnosis not present

## 2022-07-22 DIAGNOSIS — J9621 Acute and chronic respiratory failure with hypoxia: Secondary | ICD-10-CM | POA: Diagnosis not present

## 2022-07-22 DIAGNOSIS — R6520 Severe sepsis without septic shock: Secondary | ICD-10-CM | POA: Diagnosis not present

## 2022-07-22 LAB — CBC WITH DIFFERENTIAL/PLATELET
Abs Immature Granulocytes: 0.25 10*3/uL — ABNORMAL HIGH (ref 0.00–0.07)
Basophils Absolute: 0 10*3/uL (ref 0.0–0.1)
Basophils Relative: 0 %
Eosinophils Absolute: 0 10*3/uL (ref 0.0–0.5)
Eosinophils Relative: 0 %
HCT: 28.6 % — ABNORMAL LOW (ref 39.0–52.0)
Hemoglobin: 8.6 g/dL — ABNORMAL LOW (ref 13.0–17.0)
Immature Granulocytes: 3 %
Lymphocytes Relative: 8 %
Lymphs Abs: 0.8 10*3/uL (ref 0.7–4.0)
MCH: 25.1 pg — ABNORMAL LOW (ref 26.0–34.0)
MCHC: 30.1 g/dL (ref 30.0–36.0)
MCV: 83.4 fL (ref 80.0–100.0)
Monocytes Absolute: 1.6 10*3/uL — ABNORMAL HIGH (ref 0.1–1.0)
Monocytes Relative: 16 %
Neutro Abs: 7.3 10*3/uL (ref 1.7–7.7)
Neutrophils Relative %: 73 %
Platelets: 231 10*3/uL (ref 150–400)
RBC: 3.43 MIL/uL — ABNORMAL LOW (ref 4.22–5.81)
RDW: 19.6 % — ABNORMAL HIGH (ref 11.5–15.5)
WBC: 10 10*3/uL (ref 4.0–10.5)
nRBC: 0 % (ref 0.0–0.2)

## 2022-07-22 LAB — BASIC METABOLIC PANEL
Anion gap: 8 (ref 5–15)
BUN: 12 mg/dL (ref 8–23)
CO2: 28 mmol/L (ref 22–32)
Calcium: 7.4 mg/dL — ABNORMAL LOW (ref 8.9–10.3)
Chloride: 99 mmol/L (ref 98–111)
Creatinine, Ser: 0.79 mg/dL (ref 0.61–1.24)
GFR, Estimated: >60 mL/min (ref >60)
Glucose, Bld: 147 mg/dL — ABNORMAL HIGH (ref 70–99)
Potassium: 3.9 mmol/L (ref 3.5–5.1)
Sodium: 135 mmol/L (ref 135–145)

## 2022-07-22 LAB — PHOSPHORUS: Phosphorus: 2.6 mg/dL (ref 2.5–4.6)

## 2022-07-22 NOTE — Progress Notes (Signed)
   07/22/22 1500  Clinical Encounter Type  Visited With Patient;Health care provider  Visit Type Initial;Spiritual support;Psychological support  Referral From Chaplain  Consult/Referral To [place] made two attempts this afternoon to visit with patient.  Both times patient was deeply asleep .  Ewill continue to reach out to Jeffrey Watson for a Midwife.  Will remain available to see patient when he is able.    Rev. Lanetta Inch

## 2022-07-22 NOTE — Progress Notes (Signed)
Consultation Note Date: 07/22/2022   Patient Name: Jeffrey Watson  DOB: [birth year]  MRN: [id number]  Age / Sex: 81 y.o., male  PCP: System, Provider Not In Referring Physician: Hosie Poisson, MD  Reason for Consultation:   HPI/Patient Profile: 81 y.o. 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  Patient diagnosed with sepsis related to aspiration pneumonia also with concern for SBP.  Patient's last chemotherapy 10/19. Imaging and fluid analysis consistent with peritoneal. PMT consulted to discuss [place].   Primary Decision Maker Patient and Spouse [name]  Discussion: Discussed with TR[facility] MD, patient seen and examined today, resting in bed, chart reviewed, call placed was able to reach wife however she told me that she was at work and that she will get in touch with [place] later.     Palliative medicine is specialized medical care for people living with serious illness. It focuses on providing relief from the symptoms and stress of a serious illness. The goal is to improve quality of life for both the patient and the family. Goals of care: Broad aims of medical therapy in relation to the patient's values and preferences. Our aim is to provide medical care aimed at enabling patients to achieve the goals that matter most to them, given the circumstances of their particular medical situation and their constraints.      SUMMARY OF RECOMMENDATIONS   -Continue treatment as previous with increase in his pain regimen to oxycodone 5 mg q3hrs prn.  -full code Ongoing [place] discussions.    Code Status/Advance Care  Planning: DNR   Prognosis:   < 3 months  Discharge Planning: Home with Hospice  Primary Diagnoses: Present on Admission:  Severe sepsis with thrombocytopenia ([place])  Essential hypertension  COPD, mild (HCC)  BPH (benign prostatic hyperplasia)  Non-small cell lung cancer (NSCLC) ([place])  Centrilobular emphysema (HCC)  Adenocarcinoma of right lung ([place])  Acute on chronic respiratory failure with hypoxia ([place])  Current every day smoker   ROS: Negative unless stated in the discussion.  Vital Signs: BP (!) 141/70 (BP Location: Left Arm)   Pulse (!) 110   Temp 99.3 F (37.4 C) (Axillary)   Resp (!) 24   Ht 5\' 7"  (1.702 m)   Wt 46.3 kg   SpO2 99%   BMI 15.99 kg/m  Pain Scale: 0-10 POSS *See Group Information*: S-Acceptable,Sleep, easy to arouse Pain Score: 0-No pain Coarse breath sounds Frail appearing elderly gentleman Not as awake alert as on 07-21-22   Is thin and has related cachexia evidence Abdomen is not distended No edema   SpO2: SpO2: 99 % O2 Device:SpO2: 99 % O2 Flow Rate: .O2 Flow Rate (L/min): 7 L/min  IO: Intake/output summary:  Intake/Output Summary (Last 24 hours) at 07/22/2022 1354 Last data filed at  07/22/2022 1022 Gross per 24 hour  Intake 1451.29 ml  Output 250 ml  Net 1201.29 ml    LBM: Last BM Date : 07/20/22 Baseline Weight: Weight: 46.3 kg Most recent weight: Weight: 46.3 kg       Thank you for this consult. Palliative medicine will continue to follow and assist as needed.  Mod MDM Greater than 50%  of this time was spent counseling and coordinating care related to the above assessment and plan.  Signed by: Loistine Chance MD [place] [facility]    Please contact Palliative Medicine Team phone at [phone] for questions and concerns.  For individual provider: See Shea Evans

## 2022-07-22 NOTE — Progress Notes (Signed)
[facility]                                                                               Jeffrey Watson, is a 81 y.o. male, DOB - [birth year], QQP:[id number] Admit date - 07/15/2022    Outpatient Primary MD for the patient is System, Provider Not In  LOS - 7  days    Brief summary   81 y.o. 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  Cultures obtained.  He was started on broad-spectrum antibiotics.  CT chest, abdomen and pelvis negative for PE but showed distal esophageal wall thickening concerning for esophagitis, emphysema, RLL infiltrate/atelectasis, large ascites, omental nodularity concerning for metastasis and possible small pelvic hemorrhage.  Patient had paracentesis with removal of 2.2 bloody peritoneal fluid.   Blood cultures NGTD.  MRSA PCR negative.  Peritoneal fluid culture NGTD.  Antibiotics de-escalated to IV ceftriaxone and Flagyl.  In regards to anemia, hemoglobin reach 19 at 7.1.  Transfused 1 unit with appropriate response.  H&H stable.   Patient developed increased oxygen requirement the night of 10/29.  CXR with worsening right lung infiltrate concerning for aspiration. Repeat CXR does not show any improvement, unable to wean his oxygen down.  Currently requiring about 7 lit of [place] oxygen. Discussed with wife at bedside.  Pt seen and examined, does not appear to be doing well this morning. Febrile and tachypnea present.  Tried calling his wife but going to the voicemail.    Assessment & Plan    Assessment and  Plan:  Severe sepsis with thrombocytopenia and hypoxic respiratory failure due to aspiration pneumonia and SBP: POA.  He has ascites with tenderness and rebound tenderness.  Peritoneal fluid was cloudy and bloody.  Cell count was not performed due to blood clot in peritoneal fluid.  Patient is immunocompromised.   Blood and peritoneal fluid cultures NGTD.  MRSA PCR nonreactive.  Lactic acidosis resolved.   pro calcitonin improved.  -Antibiotics de-escalated to ceftriaxone, Flagyl and Zithromax, will finish course by tomorrow.  - febrile this morning. Blood cultures ordered. Check CXR in am.  -Aspiration precaution-HOB 45 degrees -Continue Protonix Repeat CXR does not show any improvement, unable to wean his oxygen down.  Currently requiring about 7 lit of [place] oxygen. Discussed with wife at bedside.  - unable to wean him down.  - patient understands that his cancer has spread and would like to go home with hospice when he can.  - he wanted me to talk to his wife.    Stage IV lung cancer/cancer related pain: Followed with [facility] oncology team.   On palliative chemo with  Pembrolizumab and Pemetrexed.   Last treatment on 10/19.  Imaging raises concern for peritoneal  metastasis and ascitic fluid positive for malignant cells. -Pain control -Outpatient follow-up with oncology -  Palliative medicine consulted for goals of care.  - wife at bedside discussed with palliative care on 07/21/2022, does not want to escalate care, wants to focus on comfort and plan for home with hospice.  - appreciate palliative care input.    Acute on chronic respiratory failure with hypoxia:    CXR with increased right lung and LLL infiltrate as well as atelectasis.   - pt at home on 5 lit of [place] oxygen . Currently requiring about 7 lit/min  I suspect going aspiration in the setting of patient's dysphagia.  SLp recommended liquid diet.  He is also at risk for PE without chemical VTE prophylaxis due to GI bleed -Continue  antibiotics as above -Continue LABA/LAMA/ICS with as needed DuoNeb. -Gave him incentive spirometry and encouraged him to use -Encouraged smoking cessation -Repeat CXR does not show any improvement, unable to wean his oxygen down.  Currently requiring about 7 lit of [place] oxygen. Discussed with wife at bedside.  -Aspiration precaution -Appreciate help by SLP   Emphysema/chronic COPD: -Management as above.   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  Peritoneal fluid with blood clot to calculate cell counts. SAAG -0.6 arguing against PHTN.  His abdomen is still full and tender. 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  - discussed with family at bedside.  No escalation of care at this time.    Acute blood loss anemia/possible intra-abdominal bleed: CT showed small volume pelvic hemorrhage.  Peritoneal fluid reportedly bloody.  He also had chemotherapy on 10/19.   Hemoglobin has been around 9 and stable.   AKI/azotemia: Baseline Cr 0.9-1.0. Resolved .    Pancytopenia: due to chemo and cancer.   Leukopenia and thrombocytopenia resolved.  Hgb improved. -Continue monitoring   Dysphagia/possible esophagitis/distal esophageal wall thickening -Aspiration precaution, SLP eval and PPI   Goal of care counseling: Grim prognosis. -Palliative medicine consulted and following.    Hypernatremia:resolved    Hypophosphatemia: -Monitor replenish as appropriate   Grief: recently lost his mother.  Wanted to go to mother's funeral but too weak to get up. -Emotional support   Failure to thrive/severe malnutrition/generalized weakness Body mass index is 15.99 kg/m. -Palliative medicine following -Dietitian consulted. -PT/OT    Poor prognosis, worsening respiratory status, febrile. Recommend hospice care if he continues to decline.   RN Pressure Injury Documentation:    Malnutrition  Type:  Nutrition Problem: Severe Malnutrition Etiology: chronic illness (lung cancer w/ mets)   Malnutrition Characteristics:  Signs/Symptoms: severe fat depletion, severe muscle depletion, percent weight loss Percent weight loss: 21.5 % (less than 1 month)   Nutrition Interventions:  Interventions: Ensure Enlive (each supplement provides 350kcal and 20 grams of protein)  Estimated body mass index is 15.99 kg/m as calculated from the following:   Height as of this encounter: 5\' 7"  (1.702 m).   Weight as of this encounter: 46.3 kg.  Code Status: full code.  DVT Prophylaxis:  SCDs Start: 07/15/22 1603   Level of Care: Level of care: Progressive Family Communication: Updated patient's family at bedside.   Disposition Plan:     Remains inpatient appropriate: on IV antibiotics.   Procedures:  None.   Consultants:   Palliative care.   Antimicrobials:   Anti-infectives (From admission, onward)    Start     Dose/Rate Route Frequency Ordered Stop   07/17/22 1200  cefTRIAXone (ROCEPHIN) 2 g in sodium chloride 0.9 % 100 mL IVPB  2 g 200 mL/hr over 30 Minutes Intravenous Every 24 hours 07/17/22 1115     07/16/22 1700  vancomycin (VANCOREADY) IVPB 1250 mg/250 mL  Status:  Discontinued        1,250 mg 166.7 mL/hr over 90 Minutes Intravenous Every 24 hours 07/15/22 1528 07/15/22 1918   07/16/22 1000  ceFEPIme (MAXIPIME) 2 g in sodium chloride 0.9 % 100 mL IVPB  Status:  Discontinued        2 g 200 mL/hr over 30 Minutes Intravenous Every 12 hours 07/15/22 2000 07/17/22 1115   07/15/22 2100  vancomycin (VANCOREADY) IVPB 1250 mg/250 mL  Status:  Discontinued        1,250 mg 166.7 mL/hr over 90 Minutes Intravenous Every 48 hours 07/15/22 2000 07/16/22 1123   07/15/22 2000  vancomycin (VANCOREADY) IVPB 1250 mg/250 mL  Status:  Discontinued        1,250 mg 166.7 mL/hr over 90 Minutes Intravenous Every 24 hours 07/15/22 1918 07/15/22 2000   07/15/22 1600  metroNIDAZOLE  (FLAGYL) IVPB 500 mg        500 mg 100 mL/hr over 60 Minutes Intravenous Every 12 hours 07/15/22 1518     07/15/22 1530  ceFEPIme (MAXIPIME) 2 g in sodium chloride 0.9 % 100 mL IVPB  Status:  Discontinued        2 g 200 mL/hr over 30 Minutes Intravenous Every 8 hours 07/15/22 1522 07/15/22 2000   07/15/22 1530  vancomycin (VANCOREADY) IVPB 1250 mg/250 mL  Status:  Discontinued        1,250 mg 166.7 mL/hr over 90 Minutes Intravenous  Once 07/15/22 1523 07/15/22 1918   07/15/22 1500  cefTRIAXone (ROCEPHIN) 1 g in sodium chloride 0.9 % 100 mL IVPB  Status:  Discontinued        1 g 200 mL/hr over 30 Minutes Intravenous  Once 07/15/22 1452 07/15/22 1522   07/15/22 1500  azithromycin (ZITHROMAX) 500 mg in sodium chloride 0.9 % 250 mL IVPB  Status:  Discontinued        500 mg 250 mL/hr over 60 Minutes Intravenous  Once 07/15/22 1452 07/16/22 1127        Medications  Scheduled Meds:  Chlorhexidine Gluconate Cloth  6 each Topical Daily   feeding supplement  237 mL Oral TID BM   folic acid  1 mg Oral Daily   nicotine  21 mg Transdermal Daily   mouth rinse  15 mL Mouth Rinse 4 times per day   potassium & sodium phosphates  1 packet Oral TID WC & HS   tamsulosin  0.4 mg Oral Daily   Continuous Infusions:  cefTRIAXone (ROCEPHIN)  IV 2 g (07/22/22 1115)   metronidazole Stopped (07/22/22 0506)   PRN Meds:.acetaminophen **OR** acetaminophen, alum & mag hydroxide-simeth, bisacodyl, HYDROmorphone (DILAUDID) injection, ipratropium-albuterol, ondansetron **OR** ondansetron (ZOFRAN) IV, mouth rinse, mouth rinse, oxyCODONE, polyethylene glycol, sodium chloride flush    Subjective:   Jeffrey Watson was seen and examined today.  Requesting for pain medications.     Objective:   Vitals:   07/22/22 0506 07/22/22 0641 07/22/22 0648 07/22/22 1330  BP:    (!) 141/70  Pulse:    (!) 110  Resp: (!) 22 18  (!) 24  Temp:   99.4 F (37.4 C) 99.3 F (37.4 C)  TempSrc:   Oral Axillary  SpO2:       Weight:      Height:        Intake/Output Summary (Last 24 hours) at  07/22/2022 1745 Last data filed at 07/22/2022 1022 Gross per 24 hour  Intake 1451.29 ml  Output 250 ml  Net 1201.29 ml    Filed Weights   07/16/22 0500  Weight: 46.3 kg     Exam General exam: Ill appearing gentleman, in mild distress from sob.  Respiratory system: diminished air entry throughout. Scattered rhonchi. On 7 lit of [place] oxygen.  Cardiovascular system: S1 & S2 heard, tachycardic.  No JVD, No pedal edema. Gastrointestinal system: Abdomen is soft , distended, non tender.  Central nervous system: Alert and oriented to person and place.  Extremities: no pedal edema.  Skin: No rashes,  Psychiatry: Mood & affect appropriate.      Data Reviewed:  I have personally reviewed following labs and imaging studies   CBC Lab Results  Component Value Date   WBC 10.0 07/22/2022   RBC 3.43 (L) 07/22/2022   HGB 8.6 (L) 07/22/2022   HCT 28.6 (L) 07/22/2022   MCV 83.4 07/22/2022   MCH 25.1 (L) 07/22/2022   PLT 231 07/22/2022   MCHC 30.1 07/22/2022   RDW 19.6 (H) 07/22/2022   LYMPHSABS 0.8 07/22/2022   MONOABS 1.6 (H) 07/22/2022   EOSABS 0.0 07/22/2022   BASOSABS 0.0 16/06/9603     Last metabolic panel Lab Results  Component Value Date   NA 135 07/22/2022   K 3.9 07/22/2022   CL 99 07/22/2022   CO2 28 07/22/2022   BUN 12 07/22/2022   CREATININE 0.79 07/22/2022   GLUCOSE 147 (H) 07/22/2022   GFRNONAA >60 07/22/2022   GFRAA 101 02/28/2018   CALCIUM 7.4 (L) 07/22/2022   PHOS 2.6 07/22/2022   PROT 5.1 (L) 07/17/2022   ALBUMIN 1.9 (L) 07/19/2022   BILITOT 0.6 07/17/2022   ALKPHOS 43 07/17/2022   AST 21 07/17/2022   ALT 9 07/17/2022   ANIONGAP 8 07/22/2022    CBG (last 3)  No results for input(s): "GLUCAP" in the last 72 hours.    Coagulation Profile: Recent Labs  Lab 07/17/22 0448  INR 1.2      Radiology Studies: DG Chest 2 View  Result Date: 07/21/2022 CLINICAL DATA:   Difficulty breathing, follow-up effusions EXAM: CHEST - 2 VIEW COMPARISON:  07/18/2022 FINDINGS: Cardiac shadow is stable. Right chest wall port is again noted. Emphysematous changes are again seen and stable. Bilateral pleural effusions are again identified stable from the prior exam. Patchy airspace opacity remains in the right base. IMPRESSION: No significant interval change from the prior exam. Persistent right basilar infiltrate and small effusions are seen. Underlying emphysematous changes are noted. Electronically Signed   By: Inez Catalina M.D.   On: 07/21/2022 11:48       Hosie Poisson M.D. [facility] 07/22/2022, 5:45 PM  Available via Epic secure chat 7am-7pm After 7 pm, please refer to night coverage provider listed on amion.

## 2022-07-22 NOTE — Evaluation (Signed)
Physical Therapy Evaluation Patient Details Name: Jeffrey Watson MRN: [id number] DOB: [birth year] Today's Date: 07/22/2022  History of Present Illness  Pt is an 81yo male presenting to [facility] ED on 07/15/22 with complaints of abdominal pain. Pt is s/p R inguinal hernia repair 05/27/22. 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 s/p paracentesis on 10/27 yielding 2.2L. PMH: Stage IV lung cancer on palliative chemotherapy (last 10/19), COPD on 2L, ascites, HTN, tobacco use.   Clinical Impression  Pt is an 81 y.o. male with above HPI resulting in the deficits listed below (see PT Problem List). Pt performed sit to stand transfers and bed mobility with MOD I. Ambulated 77ftx2 with no AD and supervision for safety. Pt with increased work of breathing and requiring rest breaks between all transitional movements due to SOB. HR up to 139bpm with limited activity, O2 low of 83-84% on 7L. HR 116, O2 93% at end of session on 7L. Pt significantly limited with activity tolerance due to pulmonary compromise. Pt will benefit from skilled PT to maximize safety with functional mobility to increase independence and for education on energy conservation techniques.         Recommendations for follow up therapy are one component of a multi-disciplinary discharge planning process, led by the attending physician.  Recommendations may be updated based on patient status, additional functional criteria and insurance authorization.  Follow Up Recommendations No PT follow up      Assistance Recommended at Discharge Intermittent Supervision/Assistance  Patient can return home with the following  A little help with walking and/or transfers;A little help with bathing/dressing/bathroom;Assistance with cooking/housework;Assist for transportation;Help with stairs or ramp for entrance    Equipment Recommendations None recommended by PT  Recommendations for Other Services       Functional Status Assessment Patient  has had a recent decline in their functional status and/or demonstrates limited ability to make significant improvements in function in a reasonable and predictable amount of time     Precautions / Restrictions Precautions Precautions: Fall Restrictions Weight Bearing Restrictions: No      Mobility  Bed Mobility Overal bed mobility: Modified Independent             General bed mobility comments: HOB elevated, pt states he has adjustable bed at home    Transfers Overall transfer level: Modified independent Equipment used: None               General transfer comment: increased time    Ambulation/Gait Ambulation/Gait assistance: Supervision Gait Distance (Feet): 4 Feet Assistive device: None Gait Pattern/deviations: WFL(Within Functional Limits) Gait velocity: decreased     General Gait Details: 58ft forward and backward x2. Increased work of breathing. Pt on 7L upon entry. HR up to 139bpm with limited activity, O2 low of 83-84% on 7L. HR 116, O2 93% at end of session on 3L.  Stairs            Wheelchair Mobility    Modified Rankin (Stroke Patients Only)       Balance Overall balance assessment: Mild deficits observed, not formally tested                                           Pertinent Vitals/Pain Pain Assessment Pain Assessment: No/denies pain    Home Living Family/patient expects to be discharged to:: Private residence Living Arrangements: Spouse/significant other Available Help at Discharge: Family;Available  24 hours/day Type of Home: Apartment Home Access: Level entry       Home Layout: One level Home Equipment: None      Prior Function Prior Level of Function : Independent/Modified Independent             Mobility Comments: IND ADLs Comments: IND     Hand Dominance        Extremity/Trunk Assessment   Upper Extremity Assessment Upper Extremity Assessment: Defer to OT evaluation    Lower  Extremity Assessment Lower Extremity Assessment: Generalized weakness    Cervical / Trunk Assessment Cervical / Trunk Assessment: Normal  Communication   Communication: HOH  Cognition Arousal/Alertness: Awake/alert Behavior During Therapy: WFL for tasks assessed/performed Overall Cognitive Status: Within Functional Limits for tasks assessed                                          General Comments      Exercises     Assessment/Plan    PT Assessment Patient needs continued PT services  PT Problem List Decreased strength;Decreased activity tolerance;Cardiopulmonary status limiting activity       PT Treatment Interventions DME instruction;Gait training;Functional mobility training;Therapeutic activities;Therapeutic exercise;Balance training;Patient/family education    PT Goals (Current goals can be found in the Care Plan section)  Acute Rehab PT Goals Patient Stated Goal: continue moving PT Goal Formulation: With patient Time For Goal Achievement: 08/05/22 Potential to Achieve Goals: Fair    Frequency Min 3X/week     Co-evaluation               AM-PAC PT "6 Clicks" Mobility  Outcome Measure Help needed turning from your back to your side while in a flat bed without using bedrails?: None Help needed moving from lying on your back to sitting on the side of a flat bed without using bedrails?: None Help needed moving to and from a bed to a chair (including a wheelchair)?: A Little Help needed standing up from a chair using your arms (e.g., wheelchair or bedside chair)?: A Little Help needed to walk in hospital room?: A Little Help needed climbing 3-5 steps with a railing? : A Little 6 Click Score: 20    End of Session Equipment Utilized During Treatment: Gait belt;Oxygen (7L) Activity Tolerance: Patient limited by fatigue Patient left: in bed;with call bell/phone within reach;with bed alarm set (Pt deferred transfer to recliner chair) Nurse  Communication: Mobility status PT Visit Diagnosis: Unsteadiness on feet (R26.81);Muscle weakness (generalized) (M62.81)    Time: 9390-3009 PT Time Calculation (min) (ACUTE ONLY): 16 min   Charges:   PT Evaluation $PT Eval Low Complexity: 1 Low         Festus Barren PT, DPT  Acute Rehabilitation Services  Office [phone]  07/22/2022, 3:16 PM

## 2022-07-22 NOTE — Care Management Important Message (Signed)
Important Message  Patient Details IM Letter given Name: Jeffrey Watson MRN: [id number] Date of Birth: 1941/02/27   Medicare Important Message Given:  Yes     Kerin Salen 07/22/2022, 10:15 AM

## 2022-07-22 NOTE — Progress Notes (Signed)
   07/22/22 1745  Assess: MEWS Score  Temp (!) 100.5 F (38.1 C)  BP 131/75  MAP (mmHg) 91  Pulse Rate (!) 120  ECG Heart Rate (!) 122  Resp (!) 28  Level of Consciousness Alert  SpO2 99 %  O2 Device HFNC  Assess: MEWS Score  MEWS Temp 1  MEWS Systolic 0  MEWS Pulse 2  MEWS RR 2  MEWS LOC 0  MEWS Score 5  MEWS Score Color Red  Assess: if the MEWS score is Yellow or Red  Were vital signs taken at a resting state? Yes  Focused Assessment Change from prior assessment (see assessment flowsheet)  Does the patient meet 2 or more of the SIRS criteria? Yes  Does the patient have a confirmed or suspected source of infection? No  Provider and Rapid Response Notified? Yes  MEWS guidelines implemented *See Row Information* No, previously yellow, continue vital signs every 4 hours  Treat  MEWS Interventions Administered prn meds/treatments  Pain Scale 0-10  Pain Score 0  Escalate  MEWS: Escalate Red: discuss with charge nurse/RN and provider, consider discussing with RRT  Notify: Charge Nurse/RN  Name of Charge Nurse/RN Notified Megan RN  Date Charge Nurse/RN Notified 07/22/22  Time Charge Nurse/RN Notified 1815  Assess: SIRS CRITERIA  SIRS Temperature  0  SIRS Pulse 1  SIRS Respirations  1  SIRS WBC 1  SIRS Score Sum  3

## 2022-07-22 NOTE — Progress Notes (Signed)
   07/22/22 0454  Assess: MEWS Score  Temp (!) 100.8 F (38.2 C)  ECG Heart Rate (!) 114  Resp (!) 22  SpO2 99 %  O2 Device HFNC  O2 Flow Rate (L/min) 7 L/min  Assess: MEWS Score  MEWS Temp 1  MEWS Systolic 0  MEWS Pulse 2  MEWS RR 1  MEWS LOC 0  MEWS Score 4  MEWS Score Color Red  Assess: SIRS CRITERIA  SIRS Temperature  0  SIRS Pulse 1  SIRS Respirations  1  SIRS WBC 1  SIRS Score Sum  3   Abigail NP, Pam Charge RN, and Press photographer were notified. Patient given Tylenol PRN for fever. Will continue to monitor.

## 2022-07-23 ENCOUNTER — Inpatient Hospital Stay (HOSPITAL_COMMUNITY): Payer: Medicare Other

## 2022-07-23 ENCOUNTER — Other Ambulatory Visit (HOSPITAL_COMMUNITY): Payer: Medicare Other

## 2022-07-23 DIAGNOSIS — D6959 Other secondary thrombocytopenia: Secondary | ICD-10-CM | POA: Diagnosis not present

## 2022-07-23 DIAGNOSIS — A419 Sepsis, unspecified organism: Secondary | ICD-10-CM | POA: Diagnosis not present

## 2022-07-23 DIAGNOSIS — C3491 Malignant neoplasm of unspecified part of right bronchus or lung: Secondary | ICD-10-CM | POA: Diagnosis not present

## 2022-07-23 DIAGNOSIS — J9621 Acute and chronic respiratory failure with hypoxia: Secondary | ICD-10-CM | POA: Diagnosis not present

## 2022-07-23 DIAGNOSIS — R6520 Severe sepsis without septic shock: Secondary | ICD-10-CM | POA: Diagnosis not present

## 2022-07-23 LAB — CBC WITH DIFFERENTIAL/PLATELET
Abs Immature Granulocytes: 0.24 10*3/uL — ABNORMAL HIGH (ref 0.00–0.07)
Basophils Absolute: 0 10*3/uL (ref 0.0–0.1)
Basophils Relative: 0 %
Eosinophils Absolute: 0 10*3/uL (ref 0.0–0.5)
Eosinophils Relative: 0 %
HCT: 26.3 % — ABNORMAL LOW (ref 39.0–52.0)
Hemoglobin: 8.2 g/dL — ABNORMAL LOW (ref 13.0–17.0)
Immature Granulocytes: 3 %
Lymphocytes Relative: 9 %
Lymphs Abs: 0.9 10*3/uL (ref 0.7–4.0)
MCH: 25.5 pg — ABNORMAL LOW (ref 26.0–34.0)
MCHC: 31.2 g/dL (ref 30.0–36.0)
MCV: 81.7 fL (ref 80.0–100.0)
Monocytes Absolute: 1.7 10*3/uL — ABNORMAL HIGH (ref 0.1–1.0)
Monocytes Relative: 18 %
Neutro Abs: 6.9 10*3/uL (ref 1.7–7.7)
Neutrophils Relative %: 70 %
Platelets: 220 10*3/uL (ref 150–400)
RBC: 3.22 MIL/uL — ABNORMAL LOW (ref 4.22–5.81)
RDW: 19.9 % — ABNORMAL HIGH (ref 11.5–15.5)
WBC: 9.8 10*3/uL (ref 4.0–10.5)
nRBC: 0 % (ref 0.0–0.2)

## 2022-07-23 LAB — BASIC METABOLIC PANEL
Anion gap: 7 (ref 5–15)
BUN: 15 mg/dL (ref 8–23)
CO2: 28 mmol/L (ref 22–32)
Calcium: 7.7 mg/dL — ABNORMAL LOW (ref 8.9–10.3)
Chloride: 100 mmol/L (ref 98–111)
Creatinine, Ser: 0.81 mg/dL (ref 0.61–1.24)
GFR, Estimated: >60 mL/min (ref >60)
Glucose, Bld: 107 mg/dL — ABNORMAL HIGH (ref 70–99)
Potassium: 4.7 mmol/L (ref 3.5–5.1)
Sodium: 135 mmol/L (ref 135–145)

## 2022-07-23 LAB — MAGNESIUM: Magnesium: 2 mg/dL (ref 1.7–2.4)

## 2022-07-23 MED ORDER — HYDROXYZINE HCL 10 MG PO TABS
10.0000 mg | ORAL_TABLET | Freq: Three times a day (TID) | ORAL | Status: DC | PRN
  Administered 2022-07-23 – 2022-07-25 (×3): 10 mg via ORAL
  Filled 2022-07-23 (×4): qty 1

## 2022-07-23 NOTE — Progress Notes (Signed)
                                                                                   Consultation Note Date: 07/23/2022   Patient Name: Jeffrey Watson  DOB: August 02, 1941  MRN: [id number]  Age / Sex: 81 y.o., male  PCP: System, Provider Not In Referring Physician: Hosie Poisson, MD  Reason for Consultation:  Goals of care discussions.   HPI/Patient Profile: 81 y.o. 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  Patient diagnosed with sepsis related to aspiration pneumonia also with concern for SBP.  Patient's last chemotherapy 10/19. Imaging and fluid analysis consistent with peritoneal. PMT consulted to discuss [name].   Primary Decision Maker Patient and Spouse Jeffrey Watson  Discussion: Discussed with TRH MD, recommend home with hospice.        SUMMARY OF RECOMMENDATIONS   -Continue treatment as previous with increase in his pain regimen to oxycodone 5 mg q3hrs prn.  -full code Recommend home with hospice.    Code Status/Advance Care Planning: DNR   Prognosis:   < 3 months  Discharge Planning: Home with Hospice  Primary Diagnoses: Present on Admission:  Severe sepsis with thrombocytopenia ([place])  Essential hypertension  COPD, mild (HCC)  BPH (benign prostatic hyperplasia)  Non-small cell lung cancer (NSCLC) ([place])  Centrilobular emphysema (HCC)  Adenocarcinoma of right lung ([place])  Acute on chronic respiratory failure with hypoxia ([place])  Current every day smoker   ROS: Negative unless stated in the discussion.  Vital Signs: BP 136/67 (BP Location: Left Arm)   Pulse (!) 109   Temp 99.6 F (37.6 C) (Oral)   Resp 19   Ht 5\' 7"  (1.702 m)   Wt 46.3 kg   SpO2 92%   BMI  15.99 kg/m  Pain Scale: 0-10 POSS *See Group Information*: S-Acceptable,Sleep, easy to arouse Pain Score: Asleep Coarse breath sounds Frail appearing elderly gentleman     Is thin and has cancer related cachexia evidence Abdomen is not distended No edema   SpO2: SpO2: 92 % O2 Device:SpO2: 92 % O2 Flow Rate: .O2 Flow Rate (L/min): 7 L/min  IO: Intake/output summary:  Intake/Output Summary (Last 24 hours) at 07/23/2022 1152 Last data filed at 07/23/2022 0900 Gross per 24 hour  Intake 240 ml  Output 300 ml  Net -60 ml    LBM: Last BM Date : 07/20/22 Baseline Weight: Weight: 46.3 kg Most recent weight: Weight: 46.3 kg        low MDM Greater than 50%  of this time was spent counseling and coordinating care related to the above assessment and plan.  Signed by: Loistine Chance MD [place] palliative    Please contact Palliative Medicine Team phone at [phone] for questions and concerns.  For individual provider: See Shea Evans

## 2022-07-23 NOTE — Progress Notes (Signed)
[facility]                                                                               Jeffrey Watson, is a 81 y.o. male, DOB - 1941/08/07, OFB:[id number] Admit date - 07/15/2022    Outpatient Primary MD for the patient is System, Provider Not In  LOS - 8  days    Brief summary   81 y.o. 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  Cultures obtained.  He was started on broad-spectrum antibiotics.  CT chest, abdomen and pelvis negative for PE but showed distal esophageal wall thickening concerning for esophagitis, emphysema, RLL infiltrate/atelectasis, large ascites, omental nodularity concerning for metastasis and possible small pelvic hemorrhage.  Patient had paracentesis with removal of 2.2 bloody peritoneal fluid.   Blood cultures NGTD.  MRSA PCR negative.  Peritoneal fluid culture NGTD.  Antibiotics de-escalated to IV ceftriaxone and Flagyl.  In regards to anemia, hemoglobin reach 19 at 7.1.  Transfused 1 unit with appropriate response.  H&H stable.   Patient developed increased oxygen requirement the night of 10/29.  CXR with worsening right lung infiltrate concerning for aspiration. Repeat CXR does not show any improvement, unable to wean his oxygen down.  Currently requiring about 7 lit of [place] oxygen. Discussed with wife at bedside.  Pt seen and examined, does not appear to be doing well this morning. Febrile and tachypnea present.  Tried calling his wife but going to the voicemail.    Assessment & Plan    Assessment and  Plan:  Severe sepsis with thrombocytopenia and hypoxic respiratory failure due to aspiration pneumonia and SBP: POA.  He has ascites with tenderness and rebound tenderness.  Peritoneal fluid was cloudy and bloody.  Cell count was not performed due to blood clot in peritoneal fluid.  Patient is immunocompromised.   Blood and peritoneal fluid cultures NGTD.  MRSA PCR nonreactive.  Lactic acidosis resolved. -Antibiotics de-escalated to ceftriaxone, Flagyl and Zithromax, and completed the course  -Aspiration precaution-HOB 45 degrees -Continue Protonix Repeat CXR does not show any improvement, unable to wean his oxygen down.  Currently requiring about 7 lit of [place] oxygen.  Patient understands that his cancer has spread and would like to go home with hospice when he can.  Patient continues to have fevers every day despite antibiotics, . Blood cultures done and did not reveal any so far.  Repeat CXR is the same as before. UA is pending.  Suspect from worsening cancer. Will check US paracentesis.     Stage IV lung cancer/cancer related pain: Followed with [facility] oncology team.   On palliative chemo with  Pembrolizumab and Pemetrexed.   Last treatment on 10/19.  Imaging raises concern for peritoneal  metastasis and ascitic fluid positive for malignant cells. -Pain control -Outpatient follow-up with oncology -  Palliative medicine consulted for goals of care.  - wife at bedside discussed with palliative care on 07/21/2022, does not want to escalate care, wants to focus on comfort and plan for home with hospice.  - appreciate palliative care input.    Acute on chronic respiratory failure with hypoxia:    CXR with increased right lung and LLL infiltrate as well as atelectasis on admission , unchanged on the subsequent CXRs. - pt at home on 5 lit of [place] oxygen . Currently requiring about 7 lit/min  suspect  pneumonia  and aspiration in addition to his worsening  lung cancer -Continue LABA/LAMA with as needed  DuoNeb. -continue with Incentive spirometry and flutter valve.  -Encouraged smoking cessation on discharge.  - unable to wean his oxygen down and he is very deconditioned, becomes hypoxic, tachypneic and tachycardic even with small movements .  - discussed the above with the patient and wife, and they are both agreeable to home with hospice at this time.    Emphysema/chronic COPD: -Management as above.   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  Peritoneal fluid with blood clot to calculate cell counts. SAAG -0.6 arguing against PHTN.   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  - discussed with family at bedside.  No escalation of care at this time as per  wife.    Acute blood loss anemia/possible intra-abdominal bleed: CT showed small volume pelvic hemorrhage.  Peritoneal fluid reportedly bloody.  He also had chemotherapy on 10/19.   Hemoglobin  slowly dropping . Currently hemoglobin at 8.  Transfuse hemoglobin greater than 7.   AKI/azotemia: Baseline Cr 0.9-1.0. Resolved .    Pancytopenia: due to chemo and cancer.   Leukopenia and thrombocytopenia resolved.  Hgb improved. -Continue monitoring   Dysphagia/possible esophagitis/distal esophageal wall thickening -Aspiration precaution, SLP eval and PPI   Goal of care counseling: Grim prognosis. -Palliative medicine consulted and following.    Hypernatremia:resolved    Hypophosphatemia: -replaced.     Grief: recently lost his mother.  Wanted to go to mother's funeral but too weak to get up. -Emotional support   Failure to thrive/severe malnutrition/generalized weakness Body mass index is 15.99 kg/m. -Palliative medicine following -Dietitian consulted. -PT/OT    Poor prognosis, worsening respiratory status, febrile. Recommend RESIDENTIAL  hospice care if he continues to decline.  I ve called his wife today 7 times, left 2  voice mails. Unable to reach her. Discussed the plan with daughter at bedside.  Discussed with Dr Rowe Pavy with palliative care.   RN Pressure Injury Documentation:    Malnutrition Type:  Nutrition Problem: Severe Malnutrition Etiology: chronic illness (lung cancer w/ mets)   Malnutrition Characteristics:  Signs/Symptoms: severe fat depletion, severe muscle depletion, percent weight loss Percent weight loss: 21.5 % (less than 1 month)   Nutrition Interventions:  Interventions: Ensure Enlive (each supplement provides 350kcal and 20 grams of protein)  Estimated body mass index is 15.99 kg/m as calculated from the following:   Height as of this encounter: 5\' 7"  (1.702 m).   Weight as of this encounter: 46.3 kg.  Code Status: full code.  DVT Prophylaxis:  SCDs Start: 07/15/22 1603   Level of Care: Level of care: Progressive Family Communication: Updated patient's family at bedside.   Disposition Plan:     Remains inpatient appropriate: US paracentesis.   Procedures:  US PARACENTESIS.   Consultants:   Palliative care.   Antimicrobials:   Anti-infectives (From  admission, onward)    Start     Dose/Rate Route Frequency Ordered Stop   07/17/22 1200  cefTRIAXone (ROCEPHIN) 2 g in sodium chloride 0.9 % 100 mL IVPB        2 g 200 mL/hr over 30 Minutes Intravenous Every 24 hours 07/17/22 1115     07/16/22 1700  vancomycin (VANCOREADY) IVPB 1250 mg/250 mL  Status:  Discontinued        1,250 mg 166.7 mL/hr over 90 Minutes Intravenous Every 24 hours 07/15/22 1528 07/15/22 1918   07/16/22 1000  ceFEPIme (MAXIPIME) 2 g in sodium chloride 0.9 % 100 mL IVPB  Status:  Discontinued        2 g 200 mL/hr over 30 Minutes Intravenous Every 12 hours 07/15/22 2000 07/17/22 1115   07/15/22 2100  vancomycin (VANCOREADY) IVPB 1250 mg/250 mL  Status:  Discontinued        1,250 mg 166.7 mL/hr over 90 Minutes Intravenous Every 48 hours 07/15/22 2000 07/16/22 1123   07/15/22 2000  vancomycin  (VANCOREADY) IVPB 1250 mg/250 mL  Status:  Discontinued        1,250 mg 166.7 mL/hr over 90 Minutes Intravenous Every 24 hours 07/15/22 1918 07/15/22 2000   07/15/22 1600  metroNIDAZOLE (FLAGYL) IVPB 500 mg        500 mg 100 mL/hr over 60 Minutes Intravenous Every 12 hours 07/15/22 1518     07/15/22 1530  ceFEPIme (MAXIPIME) 2 g in sodium chloride 0.9 % 100 mL IVPB  Status:  Discontinued        2 g 200 mL/hr over 30 Minutes Intravenous Every 8 hours 07/15/22 1522 07/15/22 2000   07/15/22 1530  vancomycin (VANCOREADY) IVPB 1250 mg/250 mL  Status:  Discontinued        1,250 mg 166.7 mL/hr over 90 Minutes Intravenous  Once 07/15/22 1523 07/15/22 1918   07/15/22 1500  cefTRIAXone (ROCEPHIN) 1 g in sodium chloride 0.9 % 100 mL IVPB  Status:  Discontinued        1 g 200 mL/hr over 30 Minutes Intravenous  Once 07/15/22 1452 07/15/22 1522   07/15/22 1500  azithromycin (ZITHROMAX) 500 mg in sodium chloride 0.9 % 250 mL IVPB  Status:  Discontinued        500 mg 250 mL/hr over 60 Minutes Intravenous  Once 07/15/22 1452 07/16/22 1127        Medications  Scheduled Meds:  Chlorhexidine Gluconate Cloth  6 each Topical Daily   feeding supplement  237 mL Oral TID BM   folic acid  1 mg Oral Daily   nicotine  21 mg Transdermal Daily   mouth rinse  15 mL Mouth Rinse 4 times per day   potassium & sodium phosphates  1 packet Oral TID WC & HS   tamsulosin  0.4 mg Oral Daily   Continuous Infusions:  cefTRIAXone (ROCEPHIN)  IV 2 g (07/23/22 1207)   metronidazole 500 mg (07/23/22 0359)   PRN Meds:.acetaminophen **OR** acetaminophen, alum & mag hydroxide-simeth, bisacodyl, HYDROmorphone (DILAUDID) injection, hydrOXYzine, ipratropium-albuterol, ondansetron **OR** ondansetron (ZOFRAN) IV, mouth rinse, mouth rinse, oxyCODONE, polyethylene glycol, sodium chloride flush    Subjective:   Cage Gupton was seen and examined today.  Wants to go home     Objective:   Vitals:   07/22/22 2318 07/23/22 0638  07/23/22 0815 07/23/22 1320  BP: (!) 142/61 136/67  (!) 133/59  Pulse: (!) 113 (!) 109  (!) 50  Resp:  19  18  Temp: 99.6  F (37.6 C)   99.5 F (37.5 C)  TempSrc: Oral   Oral  SpO2: 97% (!) 89% 92% (!) 89%  Weight:      Height:        Intake/Output Summary (Last 24 hours) at 07/23/2022 1414 Last data filed at 07/23/2022 1228 Gross per 24 hour  Intake 240 ml  Output 500 ml  Net -260 ml    Filed Weights   07/16/22 0500  Weight: 46.3 kg     Exam General exam: Ill appearing gentleman, deconditioned , appears comfortable at rest.  Respiratory system: Diminished air entry thought out. On 7 lit of [place] OXYGEN.  Cardiovascular system: S1 & S2 heard, tachycardic, no pedal edema.  Gastrointestinal system: Abdomen is soft distended more today compared to yesterday. Bs+ Central nervous system: Alert and oriented to person and place.  Extremities: no pedal edema.  Skin: No rashes,  Psychiatry: anxious      Data Reviewed:  I have personally reviewed following labs and imaging studies   CBC Lab Results  Component Value Date   WBC 9.8 07/23/2022   RBC 3.22 (L) 07/23/2022   HGB 8.2 (L) 07/23/2022   HCT 26.3 (L) 07/23/2022   MCV 81.7 07/23/2022   MCH 25.5 (L) 07/23/2022   PLT 220 07/23/2022   MCHC 31.2 07/23/2022   RDW 19.9 (H) 07/23/2022   LYMPHSABS 0.9 07/23/2022   MONOABS 1.7 (H) 07/23/2022   EOSABS 0.0 07/23/2022   BASOSABS 0.0 52/77/8242     Last metabolic panel Lab Results  Component Value Date   NA 135 07/23/2022   K 4.7 07/23/2022   CL 100 07/23/2022   CO2 28 07/23/2022   BUN 15 07/23/2022   CREATININE 0.81 07/23/2022   GLUCOSE 107 (H) 07/23/2022   GFRNONAA >60 07/23/2022   GFRAA 101 02/28/2018   CALCIUM 7.7 (L) 07/23/2022   PHOS 2.6 07/22/2022   PROT 5.1 (L) 07/17/2022   ALBUMIN 1.9 (L) 07/19/2022   BILITOT 0.6 07/17/2022   ALKPHOS 43 07/17/2022   AST 21 07/17/2022   ALT 9 07/17/2022   ANIONGAP 7 07/23/2022    CBG (last 3)  No results for  input(s): "GLUCAP" in the last 72 hours.    Coagulation Profile: Recent Labs  Lab 07/17/22 0448  INR 1.2      Radiology Studies: No results found.     Hosie Poisson M.D. [facility] 07/23/2022, 2:14 PM  Available via Epic secure chat 7am-7pm After 7 pm, please refer to night coverage provider listed on amion.

## 2022-07-23 NOTE — Progress Notes (Signed)
Patient with a fever of 101.2. Patient refused to have blankets removed to help bring down temp. This RN was told in report that family did not want patient to have Tylenol. Patient was unsure of the reason as there is no active allergy. Called wife that went straight to VM. Spoke with daughter Dagoberto Ligas) that said patient used to have liver issues and was told to avoid. Advised patient would not be able to have NSAIDs d/t condition. Family agreed to allow Tylenol to be given. Daughter also expressed concern that patient does not have an appetite and also still has difficulty swallowing. Will forward information to dayshift for rounding provider.   2226: Re-checked temp- 98.6

## 2022-07-24 ENCOUNTER — Inpatient Hospital Stay (HOSPITAL_COMMUNITY): Payer: Medicare Other

## 2022-07-24 DIAGNOSIS — D6959 Other secondary thrombocytopenia: Secondary | ICD-10-CM | POA: Diagnosis not present

## 2022-07-24 DIAGNOSIS — C3491 Malignant neoplasm of unspecified part of right bronchus or lung: Secondary | ICD-10-CM | POA: Diagnosis not present

## 2022-07-24 DIAGNOSIS — J9621 Acute and chronic respiratory failure with hypoxia: Secondary | ICD-10-CM | POA: Diagnosis not present

## 2022-07-24 DIAGNOSIS — A419 Sepsis, unspecified organism: Secondary | ICD-10-CM | POA: Diagnosis not present

## 2022-07-24 LAB — URINALYSIS, ROUTINE W REFLEX MICROSCOPIC
Bilirubin Urine: NEGATIVE
Glucose, UA: NEGATIVE mg/dL
Hgb urine dipstick: NEGATIVE
Ketones, ur: NEGATIVE mg/dL
Nitrite: NEGATIVE
Protein, ur: NEGATIVE mg/dL
Specific Gravity, Urine: 1.024 (ref 1.005–1.030)
pH: 5 (ref 5.0–8.0)

## 2022-07-24 MED ORDER — LIDOCAINE HCL 1 % IJ SOLN
INTRAMUSCULAR | Status: AC
  Administered 2022-07-24: 10 mL
  Filled 2022-07-24: qty 20

## 2022-07-24 NOTE — Progress Notes (Signed)
                                                                                   Consultation Note Date: 07/24/2022   Patient Name: Jeffrey Watson  DOB: 11/27/1940  MRN: [id number]  Age / Sex: 81 y.o., male  PCP: System, Provider Not In Referring Physician: Hosie Poisson, MD  Reason for Consultation:  Goals of care discussions.   HPI/Patient Profile: 81 y.o. male  with past medical history of stage IV lung cancer on palliative chemotherapy with pemetrexed and pembrolizumab maintenance 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  Patient diagnosed with sepsis related to aspiration pneumonia also with concern for SBP.  Patient's last chemotherapy 10/19. Imaging and fluid analysis consistent with peritoneal. PMT consulted to discuss [place].   Primary Decision Maker Patient and Spouse [name]  Discussion: Discussed with [facility] MD, recommend home with hospice.   Discussed with wife and daughter at bedside.      SUMMARY OF RECOMMENDATIONS   -Continue treatment as previous with increase in his pain regimen to oxycodone 5 mg q3hrs prn.  DNR Recommend home with hospice.    Code Status/Advance Care Planning: DNR   Prognosis:   < 3 months  Discharge Planning: Home with Hospice  Primary Diagnoses: Present on Admission:  Severe sepsis with thrombocytopenia ([place])  Essential hypertension  COPD, mild (HCC)  BPH (benign prostatic hyperplasia)  Non-small cell lung cancer (NSCLC) ([place])  Centrilobular emphysema (HCC)  Adenocarcinoma of right lung ([place])  Acute on chronic respiratory failure with hypoxia ([place])  Current every day smoker   ROS: Negative unless stated in the discussion.  Vital Signs: BP 116/68   Pulse (!) 109   Temp 98.7 F (37.1 C) (Oral)   Resp 20   Ht 5\' 7"  (1.702 m)   Wt 46.3 kg   SpO2  93%   BMI 15.99 kg/m  Pain Scale: 0-10 POSS *See Group Information*: 1-Acceptable,Awake and alert Pain Score: 0-No pain Coarse breath sounds Frail appearing elderly gentleman     Is thin and has cancer related cachexia evidence Abdomen is not distended No edema   SpO2: SpO2: 93 % O2 Device:SpO2: 93 % O2 Flow Rate: .O2 Flow Rate (L/min): 8 L/min  IO: Intake/output summary:  Intake/Output Summary (Last 24 hours) at 07/24/2022 1249 Last data filed at 07/24/2022 0900 Gross per 24 hour  Intake 390 ml  Output 150 ml  Net 240 ml    LBM: Last BM Date : 07/20/22 Baseline Weight: Weight: 46.3 kg Most recent weight: Weight: 46.3 kg        mod MDM Greater than 50%  of this time was spent counseling and coordinating care related to the above assessment and plan.  Signed by: Loistine Chance MD [place] [facility]    Please contact Palliative Medicine Team phone at [phone] for questions and concerns.  For individual provider: See Shea Evans

## 2022-07-24 NOTE — Procedures (Signed)
PROCEDURE SUMMARY:  Successful US guided paracentesis from left lateral abdomen.  Yielded 1.4 liters of bloody fluid.  No immediate complications.  Pt tolerated well.   Specimen was not sent for labs.  EBL < 49mL  Docia Barrier PA-C 07/24/2022 10:43 AM

## 2022-07-24 NOTE — Progress Notes (Signed)
[facility]) Hospital Liaison Note   Received request from [facility], [facility], for hospice services at home after discharge.    Spoke with Wife and daughters to initiate education related to hospice philosophy, services, and team approach to care. Family verbalized understanding of information given. Per discussion, the plan is for patient to discharge home via ambulance tomorrow.    DME needs discussed. Patient has the following equipment in the home (Purchased privately):  Hospital bed Patient requests the following equipment for delivery: Air mattress and bedside commode  Address verified and is correct in the chart. Daughter, Dagoberto Ligas is the family member to contact to arrange time of equipment delivery.    Please send signed and completed DNR home with patient/family. Please provide prescriptions at discharge as needed to ensure ongoing symptom management.    [facility] information and contact numbers given to family & above information shared with TOC.   Please call with any questions/concerns.    Thank you for the opportunity to participate in this patient's care.   Zigmund Gottron  [facility] Liaison  [phone]

## 2022-07-24 NOTE — TOC Progression Note (Signed)
Transition of Care [facility]) - Progression Note    Patient Details  Name: Jeffrey Watson MRN: [id number] Date of Birth: Oct 16, 1940  [facility] CM/SW Contact  Aseem Sessums, Juliann Pulse, RN Phone Number: 07/24/2022, 12:35 PM  Clinical Narrative: Referral for home w/hospice to [facility] sent. Spoke to dtr Ariell in agreement. Await outcome of eval.      Expected Discharge Plan: Home w Hospice Care Barriers to Discharge: Continued Medical Work up  Expected Discharge Plan and Services Expected Discharge Plan: [place] Determinants of Health (SDOH) Interventions    Readmission Risk Interventions     No data to display

## 2022-07-24 NOTE — Progress Notes (Signed)
[facility]                                                                               Jeffrey Watson, is a 81 y.o. male, DOB - Apr 13, 1941, WLS:[id number] Admit date - 07/15/2022    Outpatient Primary MD for the patient is System, Provider Not In  LOS - 9  days    Brief summary   81 y.o. 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  Cultures obtained.  He was started on broad-spectrum antibiotics.  CT chest, abdomen and pelvis negative for PE but showed distal esophageal wall thickening concerning for esophagitis, emphysema, RLL infiltrate/atelectasis, large ascites, omental nodularity concerning for metastasis and possible small pelvic hemorrhage.  Patient had paracentesis with removal of 2.2 bloody peritoneal fluid.   Blood cultures NGTD.  MRSA PCR negative.  Peritoneal fluid culture NGTD.  Antibiotics de-escalated to IV ceftriaxone and Flagyl.  In regards to anemia, hemoglobin reach 19 at 7.1.  Transfused 1 unit with appropriate response.  H&H stable.   Patient developed increased oxygen requirement the night of 10/29.  CXR with worsening right lung infiltrate concerning for aspiration. Repeat CXR does not show any improvement, unable to wean his oxygen down.  Currently requiring about 7 lit of [place] oxygen. Discussed with wife at bedside.  Pt seen and examined,     Assessment & Plan    Assessment and Plan:  Severe sepsis with thrombocytopenia and hypoxic respiratory failure due to aspiration pneumonia and SBP: POA.  He has  ascites with tenderness and rebound tenderness.  Peritoneal fluid was cloudy and bloody.  Cell count was not performed due to blood clot in peritoneal fluid.  Patient is immunocompromised.   Blood and peritoneal fluid cultures NGTD.  MRSA PCR nonreactive.  Lactic acidosis resolved. -Antibiotics de-escalated to ceftriaxone, Flagyl and Zithromax, and completed the course  -Aspiration precaution-HOB 45 degrees -Continue Protonix Repeat CXR does not show any improvement, unable to wean his oxygen down.  Currently requiring about 7 lit of [place] oxygen.  Patient understands that his cancer has spread and would like to go home with hospice when he can.  Patient continues to have fevers every day despite antibiotics, . Blood cultures done and did not reveal any so far.  Repeat CXR is the same as before. UA is pending.  Persistent fever Suspect from his lung ca. .  US paracentesis ordered, and fluid taken out .  Family wants to know if he can get the peritoneal tap for recurrent malignant ascites. Will request IR to schedule placement outpatient.     Stage IV lung cancer/cancer related pain: Followed with [facility] oncology team.   On palliative chemo with  Pembrolizumab and Pemetrexed.   Last treatment on 10/19.  Imaging raises concern for peritoneal  metastasis and ascitic fluid  positive for malignant cells. -Pain control -Outpatient follow-up with oncology -Palliative medicine consulted for goals of care.  - wife at bedside discussed with palliative care on 07/21/2022, does not want to escalate care, wants to focus on comfort and plan for home with hospice.  - appreciate palliative care input.    Acute on chronic respiratory failure with hypoxia:    CXR with increased right lung and LLL infiltrate as well as atelectasis on admission , unchanged on the subsequent CXRs. - pt at home on 5 lit of [place] oxygen . Currently requiring about 7 lit/min  suspect  pneumonia  and aspiration in addition to his worsening   lung cancer -Continue LABA/LAMA with as needed DuoNeb. -continue with Incentive spirometry and flutter valve.  -Encouraged smoking cessation on discharge.  - unable to wean his oxygen down and he is very deconditioned, becomes hypoxic, tachypneic and tachycardic even with small movements .  - discussed the above with the patient and wife, and they are both agreeable to home with hospice at this time.  - hospice being arranged by TOC and possible d.c in am .    Emphysema/chronic COPD: -Management as above.   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  Peritoneal fluid with blood clot to calculate cell counts. SAAG -0.6 arguing against PHTN.   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  - discussed with family at bedside.  No escalation of care at this time as per  wife.    Acute blood loss anemia/possible intra-abdominal bleed: CT showed small volume pelvic hemorrhage.  Peritoneal fluid reportedly bloody.  He also had chemotherapy on 10/19.   Hemoglobin  slowly dropping . Currently hemoglobin at 8.  Transfuse hemoglobin greater than 7.   AKI/azotemia: Baseline Cr 0.9-1.0. Resolved .    Pancytopenia: due to chemo and cancer.   Leukopenia and thrombocytopenia resolved.  Hgb improved. -Continue monitoring   Dysphagia/possible esophagitis/distal esophageal wall thickening -Aspiration precaution, SLP eval and PPI   Goal of care counseling: Grim prognosis. -Palliative medicine consulted and following.    Hypernatremia:resolved    Hypophosphatemia: -replaced.     Grief: recently lost his mother.  Wanted to go to mother's funeral but too weak to get up. -Emotional support   Failure to thrive/severe malnutrition/generalized weakness Body mass index is 15.99 kg/m. -Palliative medicine following -Dietitian consulted. -PT/OT    Poor prognosis, worsening respiratory status, febrile.  Recommend RESIDENTIAL  hospice care if he continues to decline.  Discussed with Dr Rowe Pavy with palliative care.   RN Pressure Injury Documentation:    Malnutrition Type:  Nutrition Problem: Severe Malnutrition Etiology: chronic illness (lung cancer w/ mets)   Malnutrition Characteristics:  Signs/Symptoms: severe fat depletion, severe muscle depletion, percent weight loss Percent weight loss: 21.5 % (less than 1 month)   Nutrition Interventions:  Interventions: Ensure Enlive (each supplement provides 350kcal and 20 grams of protein)  Estimated body mass index is 15.99 kg/m as calculated from the following:   Height as of this encounter: 5\' 7"  (1.702 m).   Weight as of this encounter: 46.3 kg.  Code Status: DNR DVT Prophylaxis:  SCDs Start: 07/15/22 1603   Level of Care: Level of care: Progressive Family Communication: Updated patient's family at bedside.   Disposition Plan:     Remains inpatient appropriate: US paracentesis.   Procedures:  US PARACENTESIS.   Consultants:   Palliative care.   Antimicrobials:   Anti-infectives (From admission, onward)  Start     Dose/Rate Route Frequency Ordered Stop   07/17/22 1200  cefTRIAXone (ROCEPHIN) 2 g in sodium chloride 0.9 % 100 mL IVPB        2 g 200 mL/hr over 30 Minutes Intravenous Every 24 hours 07/17/22 1115     07/16/22 1700  vancomycin (VANCOREADY) IVPB 1250 mg/250 mL  Status:  Discontinued        1,250 mg 166.7 mL/hr over 90 Minutes Intravenous Every 24 hours 07/15/22 1528 07/15/22 1918   07/16/22 1000  ceFEPIme (MAXIPIME) 2 g in sodium chloride 0.9 % 100 mL IVPB  Status:  Discontinued        2 g 200 mL/hr over 30 Minutes Intravenous Every 12 hours 07/15/22 2000 07/17/22 1115   07/15/22 2100  vancomycin (VANCOREADY) IVPB 1250 mg/250 mL  Status:  Discontinued        1,250 mg 166.7 mL/hr over 90 Minutes Intravenous Every 48 hours 07/15/22 2000 07/16/22 1123   07/15/22 2000  vancomycin (VANCOREADY) IVPB 1250  mg/250 mL  Status:  Discontinued        1,250 mg 166.7 mL/hr over 90 Minutes Intravenous Every 24 hours 07/15/22 1918 07/15/22 2000   07/15/22 1600  metroNIDAZOLE (FLAGYL) IVPB 500 mg        500 mg 100 mL/hr over 60 Minutes Intravenous Every 12 hours 07/15/22 1518     07/15/22 1530  ceFEPIme (MAXIPIME) 2 g in sodium chloride 0.9 % 100 mL IVPB  Status:  Discontinued        2 g 200 mL/hr over 30 Minutes Intravenous Every 8 hours 07/15/22 1522 07/15/22 2000   07/15/22 1530  vancomycin (VANCOREADY) IVPB 1250 mg/250 mL  Status:  Discontinued        1,250 mg 166.7 mL/hr over 90 Minutes Intravenous  Once 07/15/22 1523 07/15/22 1918   07/15/22 1500  cefTRIAXone (ROCEPHIN) 1 g in sodium chloride 0.9 % 100 mL IVPB  Status:  Discontinued        1 g 200 mL/hr over 30 Minutes Intravenous  Once 07/15/22 1452 07/15/22 1522   07/15/22 1500  azithromycin (ZITHROMAX) 500 mg in sodium chloride 0.9 % 250 mL IVPB  Status:  Discontinued        500 mg 250 mL/hr over 60 Minutes Intravenous  Once 07/15/22 1452 07/16/22 1127        Medications  Scheduled Meds:  Chlorhexidine Gluconate Cloth  6 each Topical Daily   feeding supplement  237 mL Oral TID BM   folic acid  1 mg Oral Daily   nicotine  21 mg Transdermal Daily   mouth rinse  15 mL Mouth Rinse 4 times per day   potassium & sodium phosphates  1 packet Oral TID WC & HS   tamsulosin  0.4 mg Oral Daily   Continuous Infusions:  cefTRIAXone (ROCEPHIN)  IV 2 g (07/24/22 1149)   metronidazole 500 mg (07/24/22 1711)   PRN Meds:.acetaminophen **OR** acetaminophen, alum & mag hydroxide-simeth, bisacodyl, HYDROmorphone (DILAUDID) injection, hydrOXYzine, ipratropium-albuterol, ondansetron **OR** ondansetron (ZOFRAN) IV, mouth rinse, mouth rinse, oxyCODONE, polyethylene glycol, sodium chloride flush    Subjective:   Jeffrey Watson was seen and examined today.  Wants to go home , so he can attend his mom's funeral.  He has a hospital bed. He need air mattress.  Discussed in detail int he room with mom and daughter.    Objective:   Vitals:   07/24/22 1333 07/24/22 1558 07/24/22 1630 07/24/22 1709  BP: 123/69  90/67   Pulse: (!) 113  (!) 117 (!) 120  Resp: 18  18   Temp: (!) 100.5 F (38.1 C) 98.9 F (37.2 C) 98.8 F (37.1 C)   TempSrc: Oral Oral Oral   SpO2: 90%  91% 94%  Weight:      Height:        Intake/Output Summary (Last 24 hours) at 07/24/2022 1803 Last data filed at 07/24/2022 1523 Gross per 24 hour  Intake 390 ml  Output 350 ml  Net 40 ml    Filed Weights   07/16/22 0500  Weight: 46.3 kg     Exam General exam: Ill appearing gentleman, on 8 lit of [place] Oxygen.  Respiratory system: diminished air entry throughout.  Cardiovascular system: S1 & S2 heard, RRR. No JVD,  No pedal edema. Gastrointestinal system: Abdomen is nondistended, non tender.  Normal bowel sounds heard. Central nervous system: Alert and oriented. No focal neurological deficits. Extremities: Symmetric 5 x 5 power. Skin: No rashes, lesions or ulcers Psychiatry: Mood & affect appropriate.       Data Reviewed:  I have personally reviewed following labs and imaging studies   CBC Lab Results  Component Value Date   WBC 9.8 07/23/2022   RBC 3.22 (L) 07/23/2022   HGB 8.2 (L) 07/23/2022   HCT 26.3 (L) 07/23/2022   MCV 81.7 07/23/2022   MCH 25.5 (L) 07/23/2022   PLT 220 07/23/2022   MCHC 31.2 07/23/2022   RDW 19.9 (H) 07/23/2022   LYMPHSABS 0.9 07/23/2022   MONOABS 1.7 (H) 07/23/2022   EOSABS 0.0 07/23/2022   BASOSABS 0.0 29/51/8841     Last metabolic panel Lab Results  Component Value Date   NA 135 07/23/2022   K 4.7 07/23/2022   CL 100 07/23/2022   CO2 28 07/23/2022   BUN 15 07/23/2022   CREATININE 0.81 07/23/2022   GLUCOSE 107 (H) 07/23/2022   GFRNONAA >60 07/23/2022   GFRAA 101 02/28/2018   CALCIUM 7.7 (L) 07/23/2022   PHOS 2.6 07/22/2022   PROT 5.1 (L) 07/17/2022   ALBUMIN 1.9 (L) 07/19/2022   BILITOT 0.6 07/17/2022    ALKPHOS 43 07/17/2022   AST 21 07/17/2022   ALT 9 07/17/2022   ANIONGAP 7 07/23/2022    CBG (last 3)  No results for input(s): "GLUCAP" in the last 72 hours.    Coagulation Profile: No results for input(s): "INR", "PROTIME" in the last 168 hours.    Radiology Studies: US Paracentesis  Result Date: 07/24/2022 INDICATION: Patient with stage IV lung cancer, recurrent malignant ascites. Request is made for therapeutic paracentesis. EXAM: ULTRASOUND GUIDED THERAPEUTIC PARACENTESIS MEDICATIONS: 10 mL 1% lidocaine COMPLICATIONS: None immediate. PROCEDURE: Informed written consent was obtained from the patient after a discussion of the risks, benefits and alternatives to treatment. A timeout was performed prior to the initiation of the procedure. Initial ultrasound scanning demonstrates a small amount of ascites within the right lower abdominal quadrant. The right lower abdomen was prepped and draped in the usual sterile fashion. 1% lidocaine was used for local anesthesia. Following this, a 19 gauge, 7-cm, Yueh catheter was introduced. An ultrasound image was saved for documentation purposes. The paracentesis was performed. The catheter was removed and a dressing was applied. The patient tolerated the procedure well without immediate post procedural complication. FINDINGS: A total of approximately 1.6 liters of bloody fluid was removed. IMPRESSION: Successful ultrasound-guided paracentesis yielding 1.6 liters of peritoneal fluid. Read by: Brynda Greathouse PA-C Electronically Signed   By: Jenny Reichmann  Watts M.D.   On: 07/24/2022 13:07   DG Chest 2 View  Result Date: 07/23/2022 CLINICAL DATA:  Fever.  History of stage IV lung cancer. EXAM: CHEST - 2 VIEW COMPARISON:  Most recent radiograph 07/21/2022. Most recent CT 07/15/2022 FINDINGS: Right chest port remains in place. Advanced emphysema. Stable volume loss in the right hemithorax. Persistent right basilar opacity without significant change. There are small  bilateral pleural effusions. No new airspace disease. Stable heart size and mediastinal contours. IMPRESSION: 1. Persistent right basilar opacity without significant change from prior exam. 2. Small bilateral pleural effusions. 3. Advanced emphysema. Electronically Signed   By: Keith Rake M.D.   On: 07/23/2022 14:41       Hosie Poisson M.D. [facility] 07/24/2022, 6:03 PM  Available via Epic secure chat 7am-7pm After 7 pm, please refer to night coverage provider listed on amion.

## 2022-07-24 NOTE — Progress Notes (Signed)
Occupational Therapy Treatment Patient Details Name: Jeffrey Watson MRN: [id number] DOB: September 08, 1941 Today's Date: 07/24/2022   History of present illness Pt is an 81yo male presenting to [facility] ED on 07/15/22 with complaints of abdominal pain. Pt is s/p R inguinal hernia repair 05/27/22. 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 s/p paracentesis on 10/27 yielding 2.2L. PMH: Stage IV lung cancer on palliative chemotherapy (last 10/19), COPD on 2L, ascites, HTN, tobacco use.   OT comments  Patient supine in bed on 8 L HFNC, with o2 sat at 90% Patient reports wanting to get out of bed and go for a walk. However, patient just sat up in the bed - using his upper extremities on bed rails to help himself and he desatted to 80% and his HR increased to 145. It took 7 minutes to improve to 88%. While recovering patient asked therapist to wash and lotion his feet which therapist did. Therapist educated patient on energy conversation strategies and asked patient to demonstrate ability to perform figure four - which he could do. During treatment patient easily falls back asleep so therefore reiteration of education will need to happen. Overall patient significantly limited by respiratory status today as he was yesterday with PT. Physically he is able to move himself. Unsure of patient's ability to tolerate much activity. Will continue to follow.   Recommendations for follow up therapy are one component of a multi-disciplinary discharge planning process, led by the attending physician.  Recommendations may be updated based on patient status, additional functional criteria and insurance authorization.    Follow Up Recommendations  No OT follow up    Assistance Recommended at Discharge Intermittent Supervision/Assistance  Patient can return home with the following  A little help with bathing/dressing/bathroom;Assistance with cooking/housework   Equipment Recommendations  Other (comment) (AE maybe)     Recommendations for Other Services      Precautions / Restrictions Precautions Precautions: Fall Precaution Comments: 7 L HFNC, monitor O2 sat & HR Restrictions Weight Bearing Restrictions: No       Mobility Bed Mobility                    Transfers                         Balance                                           ADL either performed or assessed with clinical judgement   ADL Overall ADL's : Needs assistance/impaired             Lower Body Bathing: Total assistance;Bed level Lower Body Bathing Details (indicate cue type and reason): Patient asked for his feet to be washed. He could not assist as he was recovering his o2 sat. He did demonstrate the ability to cross his leg over his knee to reach his feet. Therapist educated patient to bring feet to him and not to bend down to reduce [facility] and preserve energy. Patient verbalized understanding but unsure if information will retain. He lays his head down and closes his eyes very quickly.                     Functional mobility during ADLs: Supervision/safety      Extremity/Trunk Assessment Upper Extremity Assessment Upper Extremity Assessment: Generalized weakness  Lower Extremity Assessment Lower Extremity Assessment: Defer to PT evaluation   Cervical / Trunk Assessment Cervical / Trunk Assessment: Normal    Vision   Vision Assessment?: No apparent visual deficits   Perception     Praxis      Cognition Arousal/Alertness: Awake/alert Behavior During Therapy: WFL for tasks assessed/performed Overall Cognitive Status: Within Functional Limits for tasks assessed                                          Exercises      Shoulder Instructions       General Comments O2 sat dropped to 80% and HR up to 145 with just sitting up in bed. Patient exhibited increased WOB and respirations. And asked to lay back down.    Pertinent Vitals/ Pain        Pain Assessment Pain Assessment: No/denies pain  Home Living                                          Prior Functioning/Environment              Frequency  Min 2X/week        Progress Toward Goals  OT Goals(current goals can now be found in the care plan section)     Acute Rehab OT Goals Patient Stated Goal: to go for a walk OT Goal Formulation: With patient Time For Goal Achievement: 07/31/22 Potential to Achieve Goals: Fair  Plan      Co-evaluation                 AM-PAC OT "6 Clicks" Daily Activity     Outcome Measure   Help from another person eating meals?: None Help from another person taking care of personal grooming?: None Help from another person toileting, which includes using toliet, bedpan, or urinal?: A Little Help from another person bathing (including washing, rinsing, drying)?: A Little Help from another person to put on and taking off regular upper body clothing?: None Help from another person to put on and taking off regular lower body clothing?: A Little 6 Click Score: 21    End of Session Equipment Utilized During Treatment: Gait belt  OT Visit Diagnosis: Pain;Muscle weakness (generalized) (M62.81)   Activity Tolerance Other (comment) (patient desats and HR elevated)   Patient Left     Nurse Communication  (o2 sat)        Time: 8032-1224 OT Time Calculation (min): 19 min  Charges: OT General Charges $OT Visit: 1 Visit OT Treatments $Self Care/Home Management : 8-22 mins  Gustavo Lah, OTR/L [place]  Office [phone]   Lenward Chancellor 07/24/2022, 12:42 PM

## 2022-07-25 DIAGNOSIS — J441 Chronic obstructive pulmonary disease with (acute) exacerbation: Secondary | ICD-10-CM

## 2022-07-25 DIAGNOSIS — C3491 Malignant neoplasm of unspecified part of right bronchus or lung: Secondary | ICD-10-CM | POA: Diagnosis not present

## 2022-07-25 DIAGNOSIS — D6959 Other secondary thrombocytopenia: Secondary | ICD-10-CM | POA: Diagnosis not present

## 2022-07-25 DIAGNOSIS — J9621 Acute and chronic respiratory failure with hypoxia: Secondary | ICD-10-CM | POA: Diagnosis not present

## 2022-07-25 DIAGNOSIS — J9601 Acute respiratory failure with hypoxia: Secondary | ICD-10-CM | POA: Diagnosis not present

## 2022-07-25 DIAGNOSIS — J690 Pneumonitis due to inhalation of food and vomit: Secondary | ICD-10-CM | POA: Diagnosis not present

## 2022-07-25 DIAGNOSIS — L8990 Pressure ulcer of unspecified site, unspecified stage: Secondary | ICD-10-CM | POA: Insufficient documentation

## 2022-07-25 DIAGNOSIS — R627 Adult failure to thrive: Secondary | ICD-10-CM | POA: Diagnosis not present

## 2022-07-25 DIAGNOSIS — A419 Sepsis, unspecified organism: Secondary | ICD-10-CM | POA: Diagnosis not present

## 2022-07-25 MED ORDER — BISACODYL 10 MG RE SUPP: 10.0000 mg | Freq: Every day | RECTAL | 0 refills | Status: AC | PRN

## 2022-07-25 MED ORDER — NICOTINE 21 MG/24HR TD PT24: 21.0000 mg | MEDICATED_PATCH | Freq: Every day | TRANSDERMAL | 0 refills | Status: AC

## 2022-07-25 MED ORDER — ALUM & MAG HYDROXIDE-SIMETH 200-200-20 MG/5ML PO SUSP: 30.0000 mL | ORAL | 0 refills | Status: AC | PRN

## 2022-07-25 MED ORDER — ONDANSETRON HCL 4 MG PO TABS: 4.0000 mg | ORAL_TABLET | Freq: Four times a day (QID) | ORAL | 0 refills | Status: AC | PRN

## 2022-07-25 MED ORDER — ENSURE ENLIVE PO LIQD: 237.0000 mL | Freq: Three times a day (TID) | ORAL | 12 refills | Status: AC

## 2022-07-25 MED ORDER — ACETAMINOPHEN 325 MG PO TABS
650.0000 mg | ORAL_TABLET | ORAL | Status: DC | PRN
  Administered 2022-07-25: 650 mg via ORAL
  Filled 2022-07-25: qty 2

## 2022-07-25 MED ORDER — HYDROXYZINE HCL 10 MG PO TABS: 10.0000 mg | ORAL_TABLET | Freq: Three times a day (TID) | ORAL | 0 refills | Status: AC | PRN

## 2022-07-25 MED ORDER — POLYETHYLENE GLYCOL 3350 17 G PO PACK: 17.0000 g | PACK | Freq: Two times a day (BID) | ORAL | 0 refills | Status: AC | PRN

## 2022-07-25 MED ORDER — HYDROMORPHONE HCL 1 MG/ML IJ SOLN: 0.5000 mg | INTRAMUSCULAR | Status: DC | PRN

## 2022-07-25 NOTE — Progress Notes (Signed)
Patient waking up frequently agitated yelling out, pulled his accessed port out, re-oriented patient and assisted him back in bed.  Patient's oxygen saturation drops to the 70s with any activity and takes a while to recover to the 90s. Currentlly on 10L sating 94% still SOB with talking and repositioning. Will continue to monitor patient.

## 2022-07-25 NOTE — Discharge Summary (Signed)
Physician Discharge Summary   Patient: Baron Parmelee MRN: [id number] DOB: June 23, 1941  Admit date:     07/15/2022  Discharge date: 07/25/22  Discharge Physician: Hosie Poisson   PCP: System, Provider Not In   Recommendations at discharge:  Please follow up with Hospice MD as recommended.  Please follow up with oncology as needed.     Discharge Diagnoses: Principal Problem:   Severe sepsis with thrombocytopenia (HCC) Active Problems:   BPH (benign prostatic hyperplasia)   Essential hypertension   COPD, mild (HCC)   Acute on chronic respiratory failure with hypoxia (HCC)   Adenocarcinoma of right lung (HCC)   Centrilobular emphysema (HCC)   Current every day smoker   Non-small cell lung cancer (NSCLC) (HCC)   Pancytopenia (HCC)   Ascites   Esophagitis   SBP (spontaneous bacterial peritonitis) ([place])   Failure to thrive in adult   Aspiration pneumonia (HCC)   AKI (acute kidney injury) ([place])   Acute blood loss anemia   Azotemia   Protein-calorie malnutrition, severe   Pressure injury of skin   Hospital Course: 81 y.o. male with PMH of stage IV lung cancer on palliative chemotherapy with pemetrexed and pembrolizumab maintenance followed with [facility]ology group in [facility], COPD/emphysema, chronic hypoxic RF on 2 L intermittently, ascites, hypertension, tobacco use disorder and central right inguinal hernia repair presenting with abdominal pain, nausea, vomiting and shortness of breath, and admitted with concern for severe sepsis with thrombocytopenia in the setting of aspiration pneumonia and possible SBP.  Cultures obtained.  He was started on broad-spectrum antibiotics.  CT chest, abdomen and pelvis negative for PE but showed distal esophageal wall thickening concerning for esophagitis, emphysema, RLL infiltrate/atelectasis, large ascites, omental nodularity concerning for metastasis and possible small pelvic hemorrhage.  Patient had paracentesis with removal of 2.2 bloody peritoneal  fluid.   Blood cultures NGTD.  MRSA PCR negative.  Peritoneal fluid culture NGTD.  Antibiotics de-escalated to IV ceftriaxone and Flagyl.  In regards to anemia, hemoglobin reach 19 at 7.1.  Transfused 1 unit with appropriate response.  H&H stable.   Patient developed increased oxygen requirement the night of 10/29.  CXR with worsening right lung infiltrate concerning for aspiration. Repeat CXR does not show any improvement, unable to wean his oxygen down.  Patient currently requiring about 8 lit of [place] oxygen.    Assessment and Plan:  Severe sepsis with thrombocytopenia and hypoxic respiratory failure due to aspiration pneumonia and SBP: POA.  He has ascites with tenderness and rebound tenderness.  Peritoneal fluid was cloudy and bloody.  Cell count was not performed due to blood clot in peritoneal fluid.  Patient is immunocompromised.   Blood and peritoneal fluid cultures NGTD.  MRSA PCR nonreactive.  Lactic acidosis resolved. -Antibiotics de-escalated to ceftriaxone, Flagyl and Zithromax, and completed the course  -Aspiration precaution-HOB 45 degrees -Continue Protonix Repeat CXR does not show any improvement, unable to wean his oxygen down.  Currently requiring about 7 lit of [place] oxygen.  Patient understands that his cancer has spread and would like to go home with hospice when he can.  Patient continues to have fevers every day despite antibiotics, . Blood cultures done and did not reveal any so far.  Repeat CXR is the same as before. UA is pending.  Persistent fever Suspect from his lung ca. .  US paracentesis ordered, and fluid taken out .  Family wants to know if he can get the peritoneal tap for recurrent malignant ascites. Will request IR to  schedule placement outpatient.      Stage IV lung cancer/cancer related pain: Followed with [facility] oncology team.   On palliative chemo with  Pembrolizumab and Pemetrexed.   Last treatment on 10/19.  Imaging raises concern for peritoneal   metastasis and ascitic fluid positive for malignant cells. -Pain control -Outpatient follow-up with oncology -Palliative medicine consulted for goals of care.  - wife at bedside discussed with palliative care on 07/21/2022, does not want to escalate care, wants to focus on comfort and plan for home with hospice.  - appreciate palliative care input.    Acute on chronic respiratory failure with hypoxia:    CXR with increased right lung and LLL infiltrate as well as atelectasis on admission , unchanged on the subsequent CXRs. - pt at home on 5 lit of [place] oxygen . Currently requiring about 7 lit/min  suspect  pneumonia  and aspiration in addition to his worsening  lung cancer -Continue LABA/LAMA with as needed DuoNeb. -continue with Incentive spirometry and flutter valve.  -Encouraged smoking cessation on discharge.  - unable to wean his oxygen down and he is very deconditioned, becomes hypoxic, tachypneic and tachycardic even with small movements .  - discussed the above with the patient and wife, and they are both agreeable to home with hospice at this time.  - hospice being arranged by TOC and possible d.c in am .    Emphysema/chronic COPD: -Management as above.   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  Peritoneal fluid with blood clot to calculate cell counts. SAAG -0.6 arguing against PHTN.   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  - discussed with family at bedside.  No escalation of care at this time as per  wife.    Acute blood loss anemia/possible intra-abdominal bleed: CT showed small volume pelvic hemorrhage.  Peritoneal fluid reportedly bloody.  He also had chemotherapy on 10/19.   Hemoglobin  slowly dropping . Currently hemoglobin at 8.  Transfuse hemoglobin greater than 7.    AKI/azotemia: Baseline Cr 0.9-1.0. Resolved .    Pancytopenia: due to chemo and cancer.    Leukopenia and thrombocytopenia resolved.  Hgb improved. -Continue monitoring   Dysphagia/possible esophagitis/distal esophageal wall thickening -Aspiration precaution, SLP eval and PPI   Goal of care counseling: Grim prognosis. -Palliative medicine consulted and following.    Hypernatremia:resolved    Hypophosphatemia: -replaced.      Grief: recently lost his mother.  Wanted to go to mother's funeral but too weak to get up. -Emotional support   Failure to thrive/severe malnutrition/generalized weakness Body mass index is 15.99 kg/m. -Palliative medicine following -Dietitian consulted. -PT/OT       Poor prognosis, worsening respiratory status, febrile. Recommend RESIDENTIAL  hospice care if he continues to decline.  Discussed with Dr Rowe Pavy with palliative care.    RN Pressure Injury Documentation:   Malnutrition Type:   Nutrition Problem: Severe Malnutrition Etiology: chronic illness (lung cancer w/ mets)     Malnutrition Characteristics:   Signs/Symptoms: severe fat depletion, severe muscle depletion, percent weight loss Percent weight loss: 21.5 % (less than 1 month)     Nutrition Interventions:   Interventions: Ensure Enlive (each supplement provides 350kcal and 20 grams of protein)   Estimated body mass index is 15.99 kg/m as calculated from the following:   Height as of this encounter: 5\' 7"  (1.702 m).   Weight as of this encounter: 46.3 kg.  Consultants: palliative care Procedures performed: US paracentesis.   Disposition: Home Diet recommendation:  Discharge Diet Orders (From admission, onward)     Start     Ordered   07/25/22 0000  Diet - low sodium heart healthy        07/25/22 1420           Regular diet DISCHARGE MEDICATION: Allergies as of 07/25/2022       Reactions   Eggs Or Egg-derived Products Nausea And Vomiting   Influenza Vaccines Nausea And Vomiting   Lisinopril Other (See Comments)   Reports chest pain with  lisinopril   Midazolam Other (See Comments)   Did not tolerate for moderate sedation with port revision and chest tube placement on 09/28/21. Unable to follow commands and severely agitated.    Penicillins Diarrhea   Has patient had a PCN reaction causing immediate rash, facial/tongue/throat swelling, SOB or lightheadedness with hypotension: No Has patient had a PCN reaction causing severe rash involving mucus membranes or skin necrosis: No Has patient had a PCN reaction that required hospitalization: No Has patient had a PCN reaction occurring within the last 10 years: No If all of the above answers are "NO", then may proceed with Cephalosporin use.        Medication List     STOP taking these medications    amLODipine 10 MG tablet Commonly known as: NORVASC   EQ Aspirin Adult Low Dose 81 MG tablet Generic drug: aspirin EC   LORazepam 1 MG tablet Commonly known as: ATIVAN   nitroGLYCERIN 0.4 MG SL tablet Commonly known as: NITROSTAT   OXYGEN   Varenicline Tartrate (Starter) 0.5 MG X 11 & 1 MG X 42 Tbpk       TAKE these medications    albuterol 1.25 MG/3ML nebulizer solution Commonly known as: ACCUNEB Take 1 ampule by nebulization in the morning, at noon, in the evening, and at bedtime.   alum & mag hydroxide-simeth 200-200-20 MG/5ML suspension Commonly known as: MAALOX/MYLANTA Take 30 mLs by mouth every 4 (four) hours as needed for indigestion.   bisacodyl 10 MG suppository Commonly known as: DULCOLAX Place 1 suppository (10 mg total) rectally daily as needed for moderate constipation.   feeding supplement Liqd Take 237 mLs by mouth 3 (three) times daily between meals.   folic acid 1 MG tablet Commonly known as: FOLVITE Take 1 mg by mouth daily.   hydrOXYzine 10 MG tablet Commonly known as: ATARAX Take 1 tablet (10 mg total) by mouth 3 (three) times daily as needed for anxiety.   ipratropium-albuterol 0.5-2.5 (3) MG/3ML Soln Commonly known as:  DUONEB Take 3 mLs by nebulization every 6 (six) hours as needed (shortness of breath).   nicotine 21 mg/24hr patch Commonly known as: NICODERM CQ - dosed in mg/24 hours Place 1 patch (21 mg total) onto the skin daily. Start taking on: July 26, 2022   ondansetron 4 MG tablet Commonly known as: ZOFRAN Take 1 tablet (4 mg total) by mouth every 6 (six) hours as needed for nausea.   oxyCODONE 5 MG immediate release tablet Commonly known as: Roxicodone Take 1 tablet (5 mg total) by mouth every 6 (six) hours as needed for up to 12 doses for severe pain.   polyethylene glycol 17 g packet Commonly known as: MIRALAX / GLYCOLAX Take 17 g by mouth 2 (two) times daily as needed for mild constipation.   Stiolto Respimat 2.5-2.5 MCG/ACT Aers Generic drug: Tiotropium Bromide-Olodaterol Inhale 2.5 mcg into the lungs  daily.   tamsulosin 0.4 MG Caps capsule Commonly known as: FLOMAX Take 1 capsule (0.4 mg total) by mouth daily. Needs a follow up with new PCP.               Durable Medical Equipment  (From admission, onward)           Start     Ordered   07/24/22 1246  For home use only DME Air overlay mattress  Once        07/24/22 1246   07/24/22 1205  For home use only DME oxygen  Once       Question Answer Comment  Length of Need Lifetime   Mode or (Route) Nasal cannula   Liters per Minute 8   Frequency Continuous (stationary and portable oxygen unit needed)   Oxygen conserving device Yes   Oxygen delivery system Gas      07/24/22 1204              Discharge Care Instructions  (From admission, onward)           Start     Ordered   07/25/22 0000  Discharge wound care:       Comments: Foam dressing   07/25/22 1420   07/25/22 0000  Discharge wound care:       Comments: Foam dressing.   07/25/22 1420            Discharge Exam: Filed Weights   07/16/22 0500  Weight: 46.3 kg   General exam: Ill appearing gentleman, on 8 lit of [place] Oxygen.   Respiratory system: diminished air entry throughout.  Cardiovascular system: S1 & S2 heard, RRR. No JVD,  No pedal edema. Gastrointestinal system: Abdomen is nondistended, non tender.  Normal bowel sounds heard. Central nervous system: Alert and oriented. No focal neurological deficits. Extremities: no pedal edema.  Skin: pressure injury on the back.  Psychiatry: flat affect.      Condition at discharge: fair  The results of significant diagnostics from this hospitalization (including imaging, microbiology, ancillary and laboratory) are listed below for reference.   Imaging Studies: US Paracentesis  Result Date: 07/24/2022 INDICATION: Patient with stage IV lung cancer, recurrent malignant ascites. Request is made for therapeutic paracentesis. EXAM: ULTRASOUND GUIDED THERAPEUTIC PARACENTESIS MEDICATIONS: 10 mL 1% lidocaine COMPLICATIONS: None immediate. PROCEDURE: Informed written consent was obtained from the patient after a discussion of the risks, benefits and alternatives to treatment. A timeout was performed prior to the initiation of the procedure. Initial ultrasound scanning demonstrates a small amount of ascites within the right lower abdominal quadrant. The right lower abdomen was prepped and draped in the usual sterile fashion. 1% lidocaine was used for local anesthesia. Following this, a 19 gauge, 7-cm, Yueh catheter was introduced. An ultrasound image was saved for documentation purposes. The paracentesis was performed. The catheter was removed and a dressing was applied. The patient tolerated the procedure well without immediate post procedural complication. FINDINGS: A total of approximately 1.6 liters of bloody fluid was removed. IMPRESSION: Successful ultrasound-guided paracentesis yielding 1.6 liters of peritoneal fluid. Read by: Brynda Greathouse PA-C Electronically Signed   By: Sandi Mariscal M.D.   On: 07/24/2022 13:07   DG Chest 2 View  Result Date: 07/23/2022 CLINICAL DATA:   Fever.  History of stage IV lung cancer. EXAM: CHEST - 2 VIEW COMPARISON:  Most recent radiograph 07/21/2022. Most recent CT 07/15/2022 FINDINGS: Right chest port remains in place. Advanced emphysema. Stable volume loss in the right  hemithorax. Persistent right basilar opacity without significant change. There are small bilateral pleural effusions. No new airspace disease. Stable heart size and mediastinal contours. IMPRESSION: 1. Persistent right basilar opacity without significant change from prior exam. 2. Small bilateral pleural effusions. 3. Advanced emphysema. Electronically Signed   By: Keith Rake M.D.   On: 07/23/2022 14:41   DG Chest 2 View  Result Date: 07/21/2022 CLINICAL DATA:  Difficulty breathing, follow-up effusions EXAM: CHEST - 2 VIEW COMPARISON:  07/18/2022 FINDINGS: Cardiac shadow is stable. Right chest wall port is again noted. Emphysematous changes are again seen and stable. Bilateral pleural effusions are again identified stable from the prior exam. Patchy airspace opacity remains in the right base. IMPRESSION: No significant interval change from the prior exam. Persistent right basilar infiltrate and small effusions are seen. Underlying emphysematous changes are noted. Electronically Signed   By: Inez Catalina M.D.   On: 07/21/2022 11:48   DG Chest 2 View  Result Date: 07/18/2022 CLINICAL DATA:  Hypoxemia EXAM: CHEST - 2 VIEW COMPARISON:  07/15/2022 FINDINGS: RIGHT jugular Port-A-Cath with tip projecting over SVC. Normal heart size, mediastinal contours, and pulmonary vascularity. Emphysematous changes with bullous disease RIGHT apex. Increased RIGHT lung infiltrates as well as increased atelectasis versus consolidation LEFT lower lobe. Small bibasilar effusions. No pneumothorax or acute osseous findings. IMPRESSION: Increased RIGHT lung infiltrates as well as atelectasis versus consolidation LEFT lower lobe. Small bibasilar effusions, greater on RIGHT. Underlying emphysematous  changes. Emphysema (ICD10-J43.9). Electronically Signed   By: Lavonia Dana M.D.   On: 07/18/2022 10:28   US Paracentesis  Result Date: 07/15/2022 INDICATION: Patient with history of stage IV lung cancer, recent right inguinal hernia repair, COPD, sepsis, ascites. Request received for diagnostic and therapeutic paracentesis. EXAM: ULTRASOUND GUIDED DIAGNOSTIC AND THERAPEUTIC PARACENTESIS MEDICATIONS: 8 mL 1% lidocaine COMPLICATIONS: None immediate. PROCEDURE: Informed written consent was obtained from the patient after a discussion of the risks, benefits and alternatives to treatment. A timeout was performed prior to the initiation of the procedure. Initial ultrasound scanning demonstrates a small to moderate amount of ascites within the left lower abdominal quadrant. The left lower abdomen was prepped and draped in the usual sterile fashion. 1% lidocaine was used for local anesthesia. Following this, a 19 gauge, 10-cm, Yueh catheter was introduced. An ultrasound image was saved for documentation purposes. The paracentesis was performed. The catheter was removed and a dressing was applied. The patient tolerated the procedure well without immediate post procedural complication. FINDINGS: A total of approximately 2.2 liters of bloody fluid was removed. Samples were sent to the laboratory as requested by the clinical team. IMPRESSION: Successful ultrasound-guided diagnostic and therapeutic paracentesis yielding 2.2 liters of peritoneal fluid. Read by: Rowe Robert, PA-C Electronically Signed   By: Markus Daft M.D.   On: 07/15/2022 17:37   CT ABDOMEN PELVIS W CONTRAST  Result Date: 07/15/2022 CLINICAL DATA:  Abdominal pain worsening over the past 4 days. EXAM: CT ABDOMEN AND PELVIS WITH CONTRAST TECHNIQUE: Multidetector CT imaging of the abdomen and pelvis was performed using the standard protocol following bolus administration of intravenous contrast. RADIATION DOSE REDUCTION: This exam was performed according  to the departmental dose-optimization program which includes automated exposure control, adjustment of the mA and/or kV according to patient size and/or use of iterative reconstruction technique. CONTRAST:  181mL OMNIPAQUE IOHEXOL 350 MG/ML SOLN COMPARISON:  07/04/2022 FINDINGS: Lower chest: Small right pleural effusion. Right lower lobe airspace disease likely reflecting atelectasis. Left basilar scratch them mild left basilar atelectasis. Hepatobiliary:  No focal liver abnormality is seen. No gallstones, gallbladder wall thickening, or biliary dilatation. Pancreas: Unremarkable. No pancreatic ductal dilatation or surrounding inflammatory changes. Spleen: Normal in size without focal abnormality. Adrenals/Urinary Tract: Adrenal glands are unremarkable. Kidneys are normal, without renal calculi, focal lesion, or hydronephrosis. Bladder is unremarkable. Stomach/Bowel: Stomach is within normal limits. No evidence of bowel wall thickening, distention, or inflammatory changes. Vascular/Lymphatic: Normal caliber abdominal aorta with mild atherosclerosis. No lymphadenopathy. Reproductive: Prostate is unremarkable. Other: Large volume ascites with a small amount of hyperdense material in the pelvis concerning for hemorrhage. Small fluid containing left inguinal hernia. Omental nodularity concerning for peritoneal spread of malignancy. Musculoskeletal: No acute osseous abnormality. No aggressive osseous lesion. Degenerative disease with disc height loss at L1-2, L2-3, L3-4, L4-5 and L5-S1. Bilateral facet arthropathy throughout the lumbar spine. IMPRESSION: 1. Large volume ascites with a small amount of hyperdense material in the pelvis concerning for hemorrhage. Omental nodularity concerning for peritoneal spread of malignancy. 2. Small right pleural effusion with right lower lobe airspace disease likely reflecting atelectasis. 3.  Aortic Atherosclerosis (ICD10-I70.0). Electronically Signed   By: Kathreen Devoid M.D.   On:  07/15/2022 14:42   CT Angio Chest PE W and/or Wo Contrast  Result Date: 07/15/2022 CLINICAL DATA:  Shortness of breath. EXAM: CT ANGIOGRAPHY CHEST WITH CONTRAST TECHNIQUE: Multidetector CT imaging of the chest was performed using the standard protocol during bolus administration of intravenous contrast. Multiplanar CT image reconstructions and MIPs were obtained to evaluate the vascular anatomy. RADIATION DOSE REDUCTION: This exam was performed according to the departmental dose-optimization program which includes automated exposure control, adjustment of the mA and/or kV according to patient size and/or use of iterative reconstruction technique. CONTRAST:  142mL OMNIPAQUE IOHEXOL 350 MG/ML SOLN COMPARISON:  June 20, 2022. FINDINGS: Cardiovascular: Satisfactory opacification of the pulmonary arteries to the segmental level. No evidence of pulmonary embolism. Normal heart size. No pericardial effusion. Mediastinum/Nodes: Thyroid gland is unremarkable. No definite adenopathy is noted. Moderate to severe diffuse esophageal wall thickening is noted concerning for esophagitis. Lungs/Pleura: No pneumothorax is noted. Small right pleural effusion is noted with associated right lower lobe atelectasis or infiltrate. Bronchial wall thickening and probable mucoid impaction is noted in the right lower lobe. Severe emphysematous disease is noted. Mild left posterior basilar subsegmental atelectasis is noted. Musculoskeletal: No chest wall abnormality. No acute or significant osseous findings. Review of the MIP images confirms the above findings. IMPRESSION: No definite evidence of pulmonary embolus. Moderate to severe diffuse esophageal wall thickening is noted concerning for esophagitis. Endoscopy is recommended for further evaluation. Small right pleural effusion is noted with associated right lower lobe atelectasis or infiltrate. There is associated bronchial wall thickening and probable mucoid impaction also noted in  the right lower lobe. Severe emphysematous disease is noted bilaterally. Aortic Atherosclerosis (ICD10-I70.0) and Emphysema (ICD10-J43.9). Electronically Signed   By: Marijo Conception M.D.   On: 07/15/2022 14:40   DG Chest Port 1 View  Result Date: 07/15/2022 CLINICAL DATA:  Shortness of breath.  Hypertension, smoking EXAM: PORTABLE CHEST 1 VIEW COMPARISON:  06/22/2022 FINDINGS: Bilateral emphysematous changes. Right lung volume loss. Right upper lobe scarring. Small right pleural effusion. No left pleural effusion. No pneumothorax. Stable cardiomediastinal silhouette. Right-sided Port-A-Cath in satisfactory position. No acute osseous abnormality. IMPRESSION: 1. No acute cardiopulmonary disease. 2. Right lung volume loss. Right upper lobe scarring. Electronically Signed   By: Kathreen Devoid M.D.   On: 07/15/2022 10:19   CT Abdomen Pelvis W Contrast  Result  Date: 07/04/2022 CLINICAL DATA:  History of metastatic lung cancer, on home oxygen. Abdominal pain. Inguinal hernia repair 2.5 weeks ago. * Tracking Code: BO * EXAM: CT ABDOMEN AND PELVIS WITH CONTRAST TECHNIQUE: Multidetector CT imaging of the abdomen and pelvis was performed using the standard protocol following bolus administration of intravenous contrast. RADIATION DOSE REDUCTION: This exam was performed according to the departmental dose-optimization program which includes automated exposure control, adjustment of the mA and/or kV according to patient size and/or use of iterative reconstruction technique. CONTRAST:  66mL OMNIPAQUE IOHEXOL 300 MG/ML  SOLN COMPARISON:  06/20/2022 FINDINGS: Lower chest: Small right pleural effusion with associated passive atelectasis. Airway thickening and airway plugging in both lower lobes and in the right middle lobe and lingula, with peripheral airspace opacity and cylindrical bronchiectasis most notable in the right lower lobe. This could reflect chronic sequela of aspiration. Emphysema noted.  Descending thoracic  aortic atherosclerosis. Hepatobiliary: New subcapsular hypodense fluid collection or possibly newly loculated ascites along the right hepatic lobe margin measuring 3.8 by 1.8 by 3.0 cm on image 12 series 2. Gallbladder unremarkable. No biliary dilatation. Pancreas: Stable borderline dilated dorsal pancreatic duct. Spleen: Unremarkable Adrenals/Urinary Tract: Nonspecific 7 mm hypodense lesion of the left kidney on image 23 series 2 appears unchanged. This is technically too small to characterize but statistically highly likely to be benign and does not warrant independent follow up. Delayed images only include parts of the kidneys. There are some faint hypoenhancing regions in the kidneys, such that pyelonephritis cannot be excluded, correlate with urine analysis. These regions are less striking on the portal venous phase images. No hydronephrosis or hydroureter. The urinary bladder appears unremarkable. Stomach/Bowel: Sigmoid colon diverticulosis. Contrast medium extends through to the descending colon. No dilated small bowel. Vascular/Lymphatic: Atherosclerosis is present, including aortoiliac atherosclerotic disease. No pathologic adenopathy identified. Reproductive: Unremarkable Other: Increased ascites. There is infiltration of the omentum for example on image 32 series 2, peritoneal spread of tumor in the omentum is not excluded this is substantially progressive compared to 03/30/2022 Musculoskeletal: Right inguinal hernia repair noted without complicating feature. There is a small direct left inguinal hernia containing ascites in continuity with the abdominal ascites. Lumbar spondylosis and degenerative disc disease resulting in multilevel lumbar impingement. 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 IMPRESSION: 1. Increased ascites and infiltration of the omentum, cannot exclude peritoneal spread of malignancy. 2. New subcapsular fluid collection along the right  hepatic lobe margin measuring 3.8 by 1.8 by 3.0 cm. 3. Small right pleural effusion with associated passive atelectasis. 4. Airway thickening and airway plugging in both lower lobes and in the right middle lobe and lingula, with peripheral airspace opacity and cylindrical bronchiectasis most notable in the right lower lobe. This could reflect chronic sequela of aspiration. 5. Faint hypoenhancing regions in the kidneys, such that pyelonephritis cannot be excluded, correlate with urine analysis. 6. Sigmoid colon diverticulosis. 7. Small direct left inguinal hernia containing ascites in continuity with the abdominal ascites. Right inguinal hernia repair without complicating feature. 8. Multilevel lumbar impingement. 9. 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 10. Stable borderline dilated dorsal pancreatic duct. 11. Aortic atherosclerosis. Aortic Atherosclerosis (ICD10-I70.0) and Emphysema (ICD10-J43.9). Electronically Signed   By: Van Clines M.D.   On: 07/04/2022 16:33    Microbiology: Results for orders placed or performed during the hospital encounter of 07/15/22  Resp Panel by RT-PCR (Flu A&B, Covid) Anterior Nasal Swab     Status: None   Collection  Time: 07/15/22  9:52 AM   Specimen: Anterior Nasal Swab  Result Value Ref Range Status   SARS Coronavirus 2 by RT PCR NEGATIVE NEGATIVE Final    Comment: (NOTE) SARS-CoV-2 target nucleic acids are NOT DETECTED.  The SARS-CoV-2 RNA is generally detectable in upper respiratory specimens during the acute phase of infection. The lowest concentration of SARS-CoV-2 viral copies this assay can detect is 138 copies/mL. A negative result does not preclude SARS-Cov-2 infection and should not be used as the sole basis for treatment or other patient management decisions. A negative result may occur with  improper specimen collection/handling, submission of specimen other than nasopharyngeal swab, presence of viral mutation(s)  within the areas targeted by this assay, and inadequate number of viral copies(<138 copies/mL). A negative result must be combined with clinical observations, patient history, and epidemiological information. The expected result is Negative.  Fact Sheet for Patients:  EntrepreneurPulse.com.au  Fact Sheet for Healthcare Providers:  IncredibleEmployment.be  This test is no t yet approved or cleared by the [place] FDA and  has been authorized for detection and/or diagnosis of SARS-CoV-2 by FDA under an Emergency Use Authorization (EUA). This EUA will remain  in effect (meaning this test can be used) for the duration of the COVID-19 declaration under Section 564(b)(1) of the Act, 21 U.S.C.section 360bbb-3(b)(1), unless the authorization is terminated  or revoked sooner.       Influenza A by PCR NEGATIVE NEGATIVE Final   Influenza B by PCR NEGATIVE NEGATIVE Final    Comment: (NOTE) The Xpert Xpress SARS-CoV-2/FLU/RSV plus assay is intended as an aid in the diagnosis of influenza from Nasopharyngeal swab specimens and should not be used as a sole basis for treatment. Nasal washings and aspirates are unacceptable for Xpert Xpress SARS-CoV-2/FLU/RSV testing.  Fact Sheet for Patients: EntrepreneurPulse.com.au  Fact Sheet for Healthcare Providers: IncredibleEmployment.be  This test is not yet approved or cleared by the [place] FDA and has been authorized for detection and/or diagnosis of SARS-CoV-2 by FDA under an Emergency Use Authorization (EUA). This EUA will remain in effect (meaning this test can be used) for the duration of the COVID-19 declaration under Section 564(b)(1) of the Act, 21 U.S.C. section 360bbb-3(b)(1), unless the authorization is terminated or revoked.  Performed at [facility], [place] [address], [place], [place] [address]   Gram stain     Status: None    Collection Time: 07/15/22  4:37 PM   Specimen: Fluid  Result Value Ref Range Status   Specimen Description FLUID PERITONEAL  Final   Special Requests NONE  Final   Gram Stain   Final    WBC SEEN NO ORGANISMS SEEN CYTOSPIN SMEAR Gram Stain Report Called to,Read Back By and Verified With: [email or web address],X AT 2138 ON 07/15/22 BY LUZOLOP Performed at [facility], [place] [address] [place], [place] [address]    Report Status 07/15/2022 FINAL  Final  Body fluid culture w Gram Stain     Status: None   Collection Time: 07/15/22  4:37 PM   Specimen: Fluid  Result Value Ref Range Status   Specimen Description   Final    FLUID PERITONEAL Performed at [place] [address], [place], [place] [address]    Special Requests   Final    NONE Performed at [facility], [place] [address], [place], [place] [address]    Gram Stain NO WBC SEEN NO ORGANISMS SEEN   Final   Culture  Final    NO GROWTH 3 DAYS Performed at [facility], [place] [address], [place], [place] [address]    Report Status 07/19/2022 FINAL  Final  Culture, blood (routine x 2)     Status: None   Collection Time: 07/15/22  7:04 PM   Specimen: BLOOD  Result Value Ref Range Status   Specimen Description   Final    BLOOD RIGHT ANTECUBITAL Performed at [place] [address], [place], [place] [address]    Special Requests   Final    BOTTLES DRAWN AEROBIC ONLY Blood Culture adequate volume Performed at [place] [address], [place], [place] [address]    Culture   Final    NO GROWTH 5 DAYS Performed at [facility], [place] [address], [place], [place] [address]    Report Status 07/20/2022 FINAL  Final  Culture, blood (routine x 2)     Status: None   Collection Time: 07/15/22  7:06 PM   Specimen: BLOOD LEFT HAND  Result Value Ref Range Status   Specimen Description   Final    BLOOD LEFT HAND Performed at [facility], [place] [address] [place], [place] [address]    Special Requests   Final    BOTTLES DRAWN AEROBIC ONLY Blood Culture adequate volume Performed at [facility] [address] [place], [place] [address]    Culture   Final    NO GROWTH 5 DAYS Performed at [facility], [place] [address] [place], [place] [address]    Report Status 07/20/2022 FINAL  Final  MRSA Next Gen by PCR, Nasal     Status: None   Collection Time: 07/15/22  8:51 PM   Specimen: Nasal Mucosa; Nasal Swab  Result Value Ref Range Status   MRSA by PCR Next Gen NOT DETECTED NOT DETECTED Final    Comment: (NOTE) The GeneXpert MRSA Assay (FDA approved for NASAL specimens only), is one component of a comprehensive MRSA colonization surveillance program. It is not intended to diagnose MRSA infection nor to guide or monitor treatment for MRSA infections. Test performance is not FDA approved in patients less than 70 years old. Performed at [facility], [place] [address] [place], [place] [address]   Culture, Respiratory w Gram Stain     Status: None   Collection Time: 07/16/22  2:25 AM   Specimen: Expectorated Sputum; Respiratory  Result Value Ref Range Status   Specimen Description   Final    EXPECTORATED SPUTUM Performed at [place] [address], [place], [place] [address]    Special Requests   Final    Immunocompromised Performed at [facility], [place] [address], [place], [place] [address]    Gram Stain   Final    RARE SQUAMOUS EPITHELIAL CELLS PRESENT RARE GRAM VARIABLE ROD RARE GRAM POSITIVE COCCI IN PAIRS    Culture   Final    FEW Normal respiratory flora-no Staph aureus or Pseudomonas seen Performed at [facility], [address] [place], [place] [address]    Report Status 07/18/2022 FINAL  Final  Culture, blood (Routine X 2) w Reflex to ID Panel     Status: None (Preliminary result)   Collection Time:  07/22/22  2:36 PM   Specimen: BLOOD  Result Value Ref Range Status   Specimen Description   Final    BLOOD BLOOD LEFT ARM Performed at [facility]  [place] [address], [place], [place] [address]    Special Requests   Final    BOTTLES DRAWN AEROBIC AND ANAEROBIC Blood Culture adequate volume Performed at [place] [address], [place], [place] [address]    Culture   Final    NO GROWTH 3 DAYS Performed at [facility], [place] [address] [place], [place] [address]    Report Status PENDING  Incomplete  Culture, blood (Routine X 2) w Reflex to ID Panel     Status: None (Preliminary result)   Collection Time: 07/22/22  2:43 PM   Specimen: BLOOD  Result Value Ref Range Status   Specimen Description   Final    BLOOD BLOOD RIGHT ARM Performed at [place] [address], [place], [place] [address]    Special Requests   Final    BOTTLES DRAWN AEROBIC ONLY Blood Culture results may not be optimal due to an inadequate volume of blood received in culture bottles Performed at [place] [address] [place], [place] [address]    Culture   Final    NO GROWTH 3 DAYS Performed at [facility], [place] [address] [place], [place] [address]    Report Status PENDING  Incomplete    Labs: CBC: Recent Labs  Lab 07/18/22 1700 07/19/22 0313 07/22/22 0440 07/23/22 0358  WBC  --  9.6 10.0 9.8  NEUTROABS  --   --  7.3 6.9  HGB 9.1* 9.1* 8.6* 8.2*  HCT 29.8* 29.0* 28.6* 26.3*  MCV  --  81.5 83.4 81.7  PLT  --  163 231 638   Basic Metabolic Panel: Recent Labs  Lab 07/19/22 0313 07/22/22 0440 07/23/22 0358  NA 138 135 135  K 3.7 3.9 4.7  CL 99 99 100  CO2 32 28 28  GLUCOSE 136* 147* 107*  BUN 16 12 15   CREATININE 0.81 0.79 0.81  CALCIUM 7.7* 7.4* 7.7*  MG 2.0  --  2.0  PHOS 1.7* 2.6  --    Liver Function Tests: Recent Labs  Lab 07/19/22 0313  ALBUMIN 1.9*   CBG: No results for  input(s): "GLUCAP" in the last 168 hours.  Discharge time spent: 45 minutes.   Signed: Hosie Poisson, MD [facility] 07/25/2022

## 2022-07-25 NOTE — Progress Notes (Signed)
PT Cancellation Note  Patient Details Name: Jeffrey Watson MRN: [id number] DOB: 02/21/1941   Cancelled Treatment:    Reason Eval/Treat Not Completed: Attempted PT tx session-RN requested PT not disturb patient. Will check back another time/day as schedule allows.    [facility]  Office: [phone]

## 2022-07-25 NOTE — Progress Notes (Signed)
Consultation Note Date: 07/25/2022   Patient Name: Jeffrey Watson  DOB: May 09, 1941  MRN: [id number]  Age / Sex: 81 y.o., male  PCP: System, Provider Not In Referring Physician: Hosie Poisson, MD  Reason for Consultation:  Goals of care discussions.   HPI/Patient Profile: 81 y.o. 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  Patient diagnosed with sepsis related to aspiration pneumonia also with concern for SBP.  Patient's last chemotherapy 10/19. Imaging and fluid analysis consistent with peritoneal. PMT consulted to discuss [facility].   Primary Decision Maker Patient and Spouse Jeffrey Watson  Discussion: Discussed with [facility] MD, recommend home with hospice.     Subjective: Patient is resting in bed, on high flow nasal cannula currently at 10 L No family present at bedside.    SUMMARY OF RECOMMENDATIONS   -Continue treatment as previous with increase in his pain regimen to oxycodone 5 mg q3hrs prn.  Will add IV Dilaudid to be used while he is in the hospital for pain and on pain symptom management. DNR Recommend home with hospice arrangements complete.   Code Status/Advance Care Planning: DNR   Prognosis:   < 3 months  Discharge Planning: Home with Hospice  Primary Diagnoses: Present on Admission:  Severe sepsis with thrombocytopenia ([place])  Essential hypertension  COPD, mild (HCC)  BPH (benign prostatic hyperplasia)  Non-small cell lung cancer (NSCLC) ([place])  Centrilobular emphysema (HCC)  Adenocarcinoma of right lung ([place])  Acute on chronic respiratory failure with hypoxia ([place])  Current every  day smoker   ROS: Negative unless stated in the discussion.  Vital Signs: BP 122/73 (BP Location: Left Arm)   Pulse (!) 122   Temp 99 F (37.2 C) (Oral)   Resp (!) 28   Ht 5\' 7"  (1.702 m)   Wt 46.3 kg   SpO2 93%   BMI 15.99 kg/m  Pain Scale: 0-10 POSS *See Group Information*: 1-Acceptable,Awake and alert Pain Score: 0-No pain Coarse breath sounds Frail appearing elderly gentleman     Is thin and has cancer related cachexia evidence Abdomen is not distended No edema   SpO2: SpO2: 93 % O2 Device:SpO2: 93 % O2 Flow Rate: .O2 Flow Rate (L/min): 10 L/min  IO: Intake/output summary:  Intake/Output Summary (Last 24 hours) at 07/25/2022 1229 Last data filed at 07/25/2022 0610 Gross per 24 hour  Intake 100 ml  Output 200 ml  Net -100 ml    LBM: Last BM Date : 07/20/22 Baseline Weight: Weight: 46.3 kg Most recent weight: Weight: 46.3 kg        mod MDM Greater than 50%  of this time was spent counseling and coordinating care related to the above assessment and plan.  Signed by: Loistine Chance MD [facility]    Please contact Palliative  Medicine Team phone at [phone] for questions and concerns.  For individual provider: See Shea Evans

## 2022-07-25 NOTE — Progress Notes (Signed)
Patient discharged home with hospice via [facility].  Discharge packet given to transport staff.  [place] left accessed per hospice RN's request for home use.

## 2022-07-25 NOTE — TOC Transition Note (Addendum)
Transition of Care [facility]) - CM/SW Discharge Note   Patient Details  Name: Jeffrey Watson MRN: [id number] Date of Birth: 1941/09/11  Transition of Care [facility]) CM/SW Contact:  Dessa Phi, RN Phone Number: 07/25/2022, 2:41 PM   Clinical Narrative:  d/c home w/hospice-[facility] rep Lenna Sciara accepted. [facility] managed all dme. [facility] called per [facility] request. No further CM needs.     Final next level of care: Home w Hospice Care Barriers to Discharge: No Barriers Identified   Patient Goals and CMS Choice        Discharge Placement                Patient to be transferred to facility by:  Corey Harold) Name of family member notified:  (Arielle(dtr)) Patient and family notified of of transfer: 07/25/22  Discharge Plan and Services                          HH Arranged: RN [facility] Agency: Hospice and [place] Date [place]: 07/25/22 Time [place]: 1440 Representative spoke with at [place]: [place]  Social Determinants of Health ([place]) Interventions     Readmission Risk Interventions     No data to display

## 2022-07-27 LAB — CULTURE, BLOOD (ROUTINE X 2)
Culture: NO GROWTH
Culture: NO GROWTH
Special Requests: ADEQUATE

## 2022-09-19 DEATH — deceased
